# Patient Record
Sex: Female | Born: 1955 | Race: White | Hispanic: No | Smoking: Current every day smoker
Health system: Southern US, Community
[De-identification: ages and names within clinical notes are randomized; demographics above are authoritative.]

## PROBLEM LIST (undated history)

## (undated) DIAGNOSIS — E079 Disorder of thyroid, unspecified: Secondary | ICD-10-CM

## (undated) DIAGNOSIS — I10 Essential (primary) hypertension: Secondary | ICD-10-CM

## (undated) DIAGNOSIS — E785 Hyperlipidemia, unspecified: Secondary | ICD-10-CM

## (undated) HISTORY — DX: Hyperlipidemia, unspecified: E78.5

## (undated) HISTORY — DX: Essential (primary) hypertension: I10

## (undated) HISTORY — PX: ABDOMINAL HYSTERECTOMY: SHX81

## (undated) HISTORY — DX: Disorder of thyroid, unspecified: E07.9

---

## 2018-07-29 ENCOUNTER — Other Ambulatory Visit (HOSPITAL_COMMUNITY)
Admission: RE | Admit: 2018-07-29 | Discharge: 2018-07-29 | Disposition: A | Discharge status: Home / Self Care | Payer: Medicaid Other | Source: Ambulatory Visit | Attending: Physician Assistant | Admitting: Physician Assistant

## 2018-07-29 ENCOUNTER — Ambulatory Visit: Payer: Self-pay | Admitting: Physician Assistant

## 2018-07-29 ENCOUNTER — Other Ambulatory Visit: Payer: Self-pay | Admitting: Physician Assistant

## 2018-07-29 ENCOUNTER — Encounter: Payer: Self-pay | Admitting: Physician Assistant

## 2018-07-29 VITALS — BP 164/84 | HR 70 | Resp 14

## 2018-07-29 DIAGNOSIS — G8929 Other chronic pain: Secondary | ICD-10-CM

## 2018-07-29 DIAGNOSIS — I10 Essential (primary) hypertension: Secondary | ICD-10-CM | POA: Insufficient documentation

## 2018-07-29 DIAGNOSIS — M25512 Pain in left shoulder: Secondary | ICD-10-CM

## 2018-07-29 DIAGNOSIS — Z7689 Persons encountering health services in other specified circumstances: Secondary | ICD-10-CM

## 2018-07-29 DIAGNOSIS — Z1211 Encounter for screening for malignant neoplasm of colon: Secondary | ICD-10-CM

## 2018-07-29 DIAGNOSIS — E039 Hypothyroidism, unspecified: Secondary | ICD-10-CM | POA: Diagnosis not present

## 2018-07-29 DIAGNOSIS — Z5320 Procedure and treatment not carried out because of patient's decision for unspecified reasons: Secondary | ICD-10-CM | POA: Insufficient documentation

## 2018-07-29 DIAGNOSIS — F17200 Nicotine dependence, unspecified, uncomplicated: Secondary | ICD-10-CM | POA: Insufficient documentation

## 2018-07-29 DIAGNOSIS — E785 Hyperlipidemia, unspecified: Secondary | ICD-10-CM | POA: Diagnosis present

## 2018-07-29 DIAGNOSIS — R69 Illness, unspecified: Secondary | ICD-10-CM | POA: Diagnosis present

## 2018-07-29 LAB — COMPREHENSIVE METABOLIC PANEL
ALBUMIN: 4.2 g/dL (ref 3.5–5.0)
ALT: 15 U/L (ref 0–44)
AST: 23 U/L (ref 15–41)
Alkaline Phosphatase: 77 U/L (ref 38–126)
Anion gap: 11 (ref 5–15)
BILIRUBIN TOTAL: 0.6 mg/dL (ref 0.3–1.2)
BUN: 12 mg/dL (ref 6–20)
CHLORIDE: 103 mmol/L (ref 98–111)
CO2: 22 mmol/L (ref 22–32)
CREATININE: 0.64 mg/dL (ref 0.44–1.00)
Calcium: 9.6 mg/dL (ref 8.9–10.3)
GFR calc Af Amer: >60 mL/min (ref >60)
GFR calc non Af Amer: >60 mL/min (ref >60)
GLUCOSE: 77 mg/dL (ref 70–99)
Potassium: 4.5 mmol/L (ref 3.5–5.1)
Sodium: 136 mmol/L (ref 135–145)
Total Protein: 7.7 g/dL (ref 6.5–8.1)

## 2018-07-29 LAB — LIPID PANEL
CHOLESTEROL: 349 mg/dL — AB (ref 0–200)
HDL: 48 mg/dL (ref >40)
LDL Cholesterol: UNDETERMINED mg/dL (ref 0–99)
TRIGLYCERIDES: 720 mg/dL — AB (ref <150)
Total CHOL/HDL Ratio: 7.3 RATIO
VLDL: UNDETERMINED mg/dL (ref 0–40)

## 2018-07-29 LAB — TSH: TSH: 10.172 u[IU]/mL — AB (ref 0.350–4.500)

## 2018-07-29 MED ORDER — LOSARTAN POTASSIUM 50 MG PO TABS: 50.0000 mg | ORAL_TABLET | Freq: Every day | ORAL | 1 refills | Status: DC

## 2018-07-29 MED ORDER — LEVOTHYROXINE SODIUM 88 MCG PO TABS: 88.0000 ug | ORAL_TABLET | Freq: Every day | ORAL | 1 refills | Status: DC
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
## 2018-08-26 ENCOUNTER — Ambulatory Visit: Payer: Self-pay | Admitting: Physician Assistant

## 2018-08-26 ENCOUNTER — Encounter: Payer: Self-pay | Admitting: Physician Assistant

## 2018-08-26 VITALS — BP 146/88 | HR 103 | Temp 98.4°F | Ht 63.5 in | Wt 123.8 lb

## 2018-08-26 DIAGNOSIS — E785 Hyperlipidemia, unspecified: Secondary | ICD-10-CM

## 2018-08-26 DIAGNOSIS — M25512 Pain in left shoulder: Secondary | ICD-10-CM

## 2018-08-26 DIAGNOSIS — I10 Essential (primary) hypertension: Secondary | ICD-10-CM

## 2018-08-26 DIAGNOSIS — G8929 Other chronic pain: Secondary | ICD-10-CM

## 2018-08-26 DIAGNOSIS — E039 Hypothyroidism, unspecified: Secondary | ICD-10-CM | POA: Insufficient documentation

## 2018-08-26 DIAGNOSIS — F17200 Nicotine dependence, unspecified, uncomplicated: Secondary | ICD-10-CM

## 2018-08-26 MED ORDER — LOSARTAN POTASSIUM 100 MG PO TABS: 100.0000 mg | ORAL_TABLET | Freq: Every day | ORAL | 1 refills | Status: DC

## 2018-08-26 MED ORDER — LEVOTHYROXINE SODIUM 88 MCG PO TABS: 88.0000 ug | ORAL_TABLET | Freq: Every day | ORAL | 1 refills | Status: DC

## 2018-08-26 MED ORDER — ATORVASTATIN CALCIUM 20 MG PO TABS: 20.0000 mg | ORAL_TABLET | Freq: Every day | ORAL | 1 refills | Status: DC

## 2018-08-26 MED ORDER — DICLOFENAC SODIUM 75 MG PO TBEC: 75.0000 mg | DELAYED_RELEASE_TABLET | Freq: Two times a day (BID) | ORAL | 0 refills | Status: DC

## 2018-08-26 MED ORDER — FISH OIL 1000 MG PO CAPS: 4.0000 | ORAL_CAPSULE | Freq: Every day | ORAL | 0 refills | Status: DC

## 2018-08-26 NOTE — Progress Notes (Signed)
BP (!) 146/88 (BP Location: Left Arm, Patient Position: Sitting, Cuff Size: Normal)   Pulse (!) 103   Temp 98.4 F (36.9 C)   Ht 5' 3.5" (1.613 m)   Wt 123 lb 12 oz (56.1 kg)   SpO2 98%   BMI 21.58 kg/m    Subjective:    Patient ID: Annette Morrison, female    DOB: 04/09/1956, 62 y.o.   MRN: [id number]  HPI: Annette Morrison is a 62 y.o. female presenting on 08/26/2018 for Hypertension and Follow-up   HPI   Pt was seen by orthopedics Monday and she got injection but she says it didn't help.   She requests pain meds.   Pt says MRI done last year at [facility] but it's not in Epic.    She says they did "her whole total spine and her shoulder".  The dr that ordered it is Dr Sharion Settler in [place]  Pt says she never went back to get the results after she had the test.   Pt is feeling better on her meds.  She is still smoking.   Relevant past medical, surgical, family and social history reviewed and updated as indicated. Interim medical history since our last visit reviewed. Allergies and medications reviewed and updated.   Current Outpatient Medications:  .  aspirin 81 MG chewable tablet, Chew 81 mg by mouth daily., Disp: , Rfl:  .  levothyroxine (SYNTHROID, LEVOTHROID) 88 MCG tablet, Take 1 tablet (88 mcg total) by mouth daily., Disp: 30 tablet, Rfl: 1 .  losartan (COZAAR) 50 MG tablet, Take 1 tablet (50 mg total) by mouth daily., Disp: 30 tablet, Rfl: 1 .  naproxen sodium (ALEVE) 220 MG tablet, Take 220 mg by mouth., Disp: , Rfl:    Review of Systems  Constitutional: Negative for appetite change, chills, diaphoresis, fatigue, fever and unexpected weight change.  HENT: Negative for congestion, dental problem, drooling, ear pain, facial swelling, hearing loss, mouth sores, sneezing, sore throat, trouble swallowing and voice change.   Eyes: Negative for pain, discharge, redness, itching and visual disturbance.  Respiratory: Negative for cough, choking, shortness of breath  and wheezing.   Cardiovascular: Negative for chest pain, palpitations and leg swelling.  Gastrointestinal: Negative for abdominal pain, blood in stool, constipation, diarrhea and vomiting.  Endocrine: Negative for cold intolerance, heat intolerance and polydipsia.  Genitourinary: Negative for decreased urine volume, dysuria and hematuria.  Musculoskeletal: Positive for arthralgias, back pain and gait problem.  Skin: Negative for rash.  Allergic/Immunologic: Negative for environmental allergies.  Neurological: Positive for headaches. Negative for seizures, syncope and light-headedness.  Hematological: Negative for adenopathy.  Psychiatric/Behavioral: Positive for dysphoric mood. Negative for agitation and suicidal ideas. The patient is not nervous/anxious.     Per HPI unless specifically indicated above     Objective:    BP (!) 146/88 (BP Location: Left Arm, Patient Position: Sitting, Cuff Size: Normal)   Pulse (!) 103   Temp 98.4 F (36.9 C)   Ht 5' 3.5" (1.613 m)   Wt 123 lb 12 oz (56.1 kg)   SpO2 98%   BMI 21.58 kg/m   Wt Readings from Last 3 Encounters:  08/26/18 123 lb 12 oz (56.1 kg)    Physical Exam  Constitutional: She is oriented to person, place, and time. She appears well-developed and well-nourished.  HENT:  Head: Normocephalic and atraumatic.  Neck: Neck supple.  Cardiovascular: Normal rate and regular rhythm.  Pulmonary/Chest: Effort normal and breath sounds normal.  Abdominal: Soft. Bowel  sounds are normal. She exhibits no mass. There is no hepatosplenomegaly. There is no tenderness.  Musculoskeletal: She exhibits no edema.  Lymphadenopathy:    She has no cervical adenopathy.  Neurological: She is alert and oriented to person, place, and time.  Skin: Skin is warm and dry.  Psychiatric: She has a normal mood and affect. Her behavior is normal.  Vitals reviewed.   Results for orders placed or performed during the hospital encounter of 07/29/18  TSH  Result  Value Ref Range   TSH 10.172 (H) 0.350 - 4.500 uIU/mL  Lipid panel  Result Value Ref Range   Cholesterol 349 (H) 0 - 200 mg/dL   Triglycerides 132 (H) <150 mg/dL   HDL 48 >44 mg/dL   Total CHOL/HDL Ratio 7.3 RATIO   VLDL UNABLE TO CALCULATE IF TRIGLYCERIDE OVER 400 mg/dL 0 - 40 mg/dL   LDL Cholesterol UNABLE TO CALCULATE IF TRIGLYCERIDE OVER 400 mg/dL 0 - 99 mg/dL  Comprehensive metabolic panel  Result Value Ref Range   Sodium 136 135 - 145 mmol/L   Potassium 4.5 3.5 - 5.1 mmol/L   Chloride 103 98 - 111 mmol/L   CO2 22 22 - 32 mmol/L   Glucose, Bld 77 70 - 99 mg/dL   BUN 12 6 - 20 mg/dL   Creatinine, Ser 0.10 0.44 - 1.00 mg/dL   Calcium 9.6 8.9 - 27.2 mg/dL   Total Protein 7.7 6.5 - 8.1 g/dL   Albumin 4.2 3.5 - 5.0 g/dL   AST 23 15 - 41 U/L   ALT 15 0 - 44 U/L   Alkaline Phosphatase 77 38 - 126 U/L   Total Bilirubin 0.6 0.3 - 1.2 mg/dL   GFR calc non Af Amer >60 >60 mL/min   GFR calc Af Amer >60 >60 mL/min   Anion gap 11 5 - 15      Assessment & Plan:    Encounter Diagnoses  Name Primary?  . Essential hypertension Yes  . Hyperlipidemia, unspecified hyperlipidemia type   . Hypothyroidism, unspecified type   . Tobacco use disorder   . Chronic left shoulder pain      -reviewed labs with pt -pt to start Fish oil 4 daily and atorvastatin -will Increase losartan to help blood pressure -pt is signed up for med[facility]ist to get help with getting her medications -record request sent to [facility] to get MRI results -pt is given rx diclofenac for pain.  She is counseled to avoid taking aleve or IBU while taking the diclofenac.  She also needs to take this medication with food.  Discussed with pt that [facility] does not prescribe narcotics -will Continue current levothyroxine (she was off it some time before labs done) -pt to follow up 4 weeks to recheck blood pressure and review MRI.  RTO sooner prn

## 2018-08-26 NOTE — Patient Instructions (Signed)

## 2018-09-22 ENCOUNTER — Ambulatory Visit: Payer: Self-pay | Admitting: Physician Assistant

## 2018-09-22 ENCOUNTER — Encounter: Payer: Self-pay | Admitting: Physician Assistant

## 2018-09-22 VITALS — BP 140/90 | HR 90 | Temp 97.9°F | Ht 63.5 in | Wt 125.2 lb

## 2018-09-22 DIAGNOSIS — G8929 Other chronic pain: Secondary | ICD-10-CM

## 2018-09-22 DIAGNOSIS — I10 Essential (primary) hypertension: Secondary | ICD-10-CM

## 2018-09-22 DIAGNOSIS — E785 Hyperlipidemia, unspecified: Secondary | ICD-10-CM

## 2018-09-22 DIAGNOSIS — F17200 Nicotine dependence, unspecified, uncomplicated: Secondary | ICD-10-CM

## 2018-09-22 DIAGNOSIS — E039 Hypothyroidism, unspecified: Secondary | ICD-10-CM

## 2018-09-22 DIAGNOSIS — M25512 Pain in left shoulder: Secondary | ICD-10-CM

## 2018-09-22 NOTE — Progress Notes (Signed)
   BP 140/90 (BP Location: Left Arm, Patient Position: Sitting, Cuff Size: Normal)   Pulse 90   Temp 97.9 F (36.6 C)   Ht 5' 3.5" (1.613 m)   Wt 125 lb 4 oz (56.8 kg)   SpO2 97%   BMI 21.84 kg/m    Subjective:    Patient ID: Annette Morrison, female    DOB: 09-04-1956, 62 y.o.   MRN: [id number]  HPI: Annette Morrison is a 62 y.o. female presenting on 09/22/2018 for Hypertension   HPI  Pt went to orthopedist yesterday and she says they are planning to do surgery on her shoulder.  Relevant past medical, surgical, family and social history reviewed and updated as indicated. Interim medical history since our last visit reviewed. Allergies and medications reviewed and updated.  CURRENT MEDS: unknown  Review of Systems  Constitutional: Positive for chills and diaphoresis. Negative for appetite change, fatigue, fever and unexpected weight change.  HENT: Negative for congestion, drooling, ear pain, facial swelling, hearing loss, mouth sores, sneezing, sore throat, trouble swallowing and voice change.   Eyes: Negative for pain, discharge, redness, itching and visual disturbance.  Respiratory: Negative for cough, choking, shortness of breath and wheezing.   Cardiovascular: Negative for chest pain, palpitations and leg swelling.  Gastrointestinal: Negative for abdominal pain, blood in stool, constipation, diarrhea and vomiting.  Endocrine: Negative for cold intolerance, heat intolerance and polydipsia.  Genitourinary: Negative for decreased urine volume, dysuria and hematuria.  Musculoskeletal: Positive for arthralgias, back pain and gait problem.  Skin: Negative for rash.  Allergic/Immunologic: Negative for environmental allergies.  Neurological: Positive for headaches. Negative for seizures, syncope and light-headedness.  Hematological: Negative for adenopathy.  Psychiatric/Behavioral: Positive for dysphoric mood. Negative for agitation and suicidal ideas. The patient is nervous/anxious.        Per HPI unless specifically indicated above     Objective:    BP 140/90 (BP Location: Left Arm, Patient Position: Sitting, Cuff Size: Normal)   Pulse 90   Temp 97.9 F (36.6 C)   Ht 5' 3.5" (1.613 m)   Wt 125 lb 4 oz (56.8 kg)   SpO2 97%   BMI 21.84 kg/m   Wt Readings from Last 3 Encounters:  09/22/18 125 lb 4 oz (56.8 kg)  08/26/18 123 lb 12 oz (56.1 kg)    Physical Exam  Constitutional: She is oriented to person, place, and time. She appears well-developed and well-nourished.  HENT:  Head: Normocephalic and atraumatic.  Neck: Neck supple.  Cardiovascular: Normal rate and regular rhythm.  Pulmonary/Chest: Effort normal and breath sounds normal.  Abdominal: Soft. Bowel sounds are normal. She exhibits no mass. There is no hepatosplenomegaly. There is no tenderness.  Musculoskeletal: She exhibits no edema.  Lymphadenopathy:    She has no cervical adenopathy.  Neurological: She is alert and oriented to person, place, and time.  Skin: Skin is warm and dry.  Psychiatric: She has a normal mood and affect. Her behavior is normal.  Vitals reviewed.       Assessment & Plan:    Encounter Diagnoses  Name Primary?  . Essential hypertension Yes  . Hypothyroidism, unspecified type   . Hyperlipidemia, unspecified hyperlipidemia type   . Tobacco use disorder   . Chronic left shoulder pain     -no changes today -pt counseled to bring meds to every appointment -counseled smoking cessaiton -pt to follow up 2 months.   RTO sooner prn

## 2018-11-23 ENCOUNTER — Ambulatory Visit: Payer: Self-pay | Admitting: Physician Assistant

## 2018-11-23 ENCOUNTER — Encounter: Payer: Self-pay | Admitting: Physician Assistant

## 2018-11-23 VITALS — BP 120/70 | HR 81 | Temp 97.7°F | Ht 63.5 in | Wt 123.2 lb

## 2018-11-23 DIAGNOSIS — Z9119 Patient's noncompliance with other medical treatment and regimen: Secondary | ICD-10-CM

## 2018-11-23 DIAGNOSIS — E785 Hyperlipidemia, unspecified: Secondary | ICD-10-CM

## 2018-11-23 DIAGNOSIS — Z91199 Patient's noncompliance with other medical treatment and regimen due to unspecified reason: Secondary | ICD-10-CM

## 2018-11-23 DIAGNOSIS — F17200 Nicotine dependence, unspecified, uncomplicated: Secondary | ICD-10-CM

## 2018-11-23 DIAGNOSIS — I10 Essential (primary) hypertension: Secondary | ICD-10-CM

## 2018-11-23 DIAGNOSIS — G8929 Other chronic pain: Secondary | ICD-10-CM

## 2018-11-23 DIAGNOSIS — Z5320 Procedure and treatment not carried out because of patient's decision for unspecified reasons: Secondary | ICD-10-CM

## 2018-11-23 DIAGNOSIS — E039 Hypothyroidism, unspecified: Secondary | ICD-10-CM

## 2018-11-23 DIAGNOSIS — M25512 Pain in left shoulder: Secondary | ICD-10-CM

## 2018-11-23 NOTE — Progress Notes (Signed)
BP 120/70 (BP Location: Left Arm, Patient Position: Sitting, Cuff Size: Normal)   Pulse 81   Temp 97.7 F (36.5 C)   Ht 5' 3.5" (1.613 m)   Wt 123 lb 4 oz (55.9 kg)   SpO2 96%   BMI 21.49 kg/m    Subjective:    Patient ID: Annette Morrison, female    DOB: [birth year], 63 y.o.   MRN: [id number]  HPI: Annette Morrison is a 63 y.o. female presenting on 11/23/2018 for Hypertension; Hyperlipidemia; and Thyroid Problem   HPI  At her last OV, pt said that she was scheduled for shoulder surgery.  She says it was to be done through workers comp.  Pt did not get her shoulder surgery done because she "had a bad feeling about it"  She did not bring her meds again to her appointment today  She has not gotten her labs done as ordered  She is still smoking.   Relevant past medical, surgical, family and social history reviewed and updated as indicated. Interim medical history since our last visit reviewed. Allergies and medications reviewed and updated.   Current Outpatient Medications:  .  atorvastatin (LIPITOR) 20 MG tablet, Take 1 tablet (20 mg total) by mouth daily., Disp: 90 tablet, Rfl: 1 .  levothyroxine (SYNTHROID, LEVOTHROID) 88 MCG tablet, Take 1 tablet (88 mcg total) by mouth daily., Disp: 90 tablet, Rfl: 1 .  losartan (COZAAR) 100 MG tablet, Take 1 tablet (100 mg total) by mouth daily., Disp: 90 tablet, Rfl: 1     Review of Systems  Constitutional: Positive for diaphoresis. Negative for appetite change, chills, fatigue, fever and unexpected weight change.  HENT: Negative for congestion, dental problem, drooling, ear pain, facial swelling, hearing loss, mouth sores, sneezing, sore throat, trouble swallowing and voice change.   Eyes: Negative for pain, discharge, redness, itching and visual disturbance.  Respiratory: Negative for cough, choking, shortness of breath and wheezing.   Cardiovascular: Negative for chest pain, palpitations and leg swelling.  Gastrointestinal: Negative  for abdominal pain, blood in stool, constipation, diarrhea and vomiting.  Endocrine: Negative for cold intolerance, heat intolerance and polydipsia.  Genitourinary: Negative for decreased urine volume, dysuria and hematuria.  Musculoskeletal: Positive for arthralgias and back pain. Negative for gait problem.  Skin: Negative for rash.  Allergic/Immunologic: Negative for environmental allergies.  Neurological: Negative for seizures, syncope, light-headedness and headaches.  Hematological: Negative for adenopathy.  Psychiatric/Behavioral: Negative for agitation, dysphoric mood and suicidal ideas. The patient is not nervous/anxious.     Per HPI unless specifically indicated above     Objective:    BP 120/70 (BP Location: Left Arm, Patient Position: Sitting, Cuff Size: Normal)   Pulse 81   Temp 97.7 F (36.5 C)   Ht 5' 3.5" (1.613 m)   Wt 123 lb 4 oz (55.9 kg)   SpO2 96%   BMI 21.49 kg/m   Wt Readings from Last 3 Encounters:  11/23/18 123 lb 4 oz (55.9 kg)  09/22/18 125 lb 4 oz (56.8 kg)  08/26/18 123 lb 12 oz (56.1 kg)    Physical Exam Vitals signs reviewed.  Constitutional:      Appearance: She is well-developed.  HENT:     Head: Normocephalic and atraumatic.  Neck:     Musculoskeletal: Neck supple.  Cardiovascular:     Rate and Rhythm: Normal rate and regular rhythm.  Pulmonary:     Effort: Pulmonary effort is normal.     Breath sounds: Normal breath sounds.  Abdominal:  General: Bowel sounds are normal.     Palpations: Abdomen is soft. There is no mass.     Tenderness: There is no abdominal tenderness.  Lymphadenopathy:     Cervical: No cervical adenopathy.  Skin:    General: Skin is warm and dry.  Neurological:     Mental Status: She is alert and oriented to person, place, and time.  Psychiatric:        Behavior: Behavior normal.         Assessment & Plan:   Encounter Diagnoses  Name Primary?  . Essential hypertension Yes  . Hypothyroidism,  unspecified type   . Hyperlipidemia, unspecified hyperlipidemia type   . Tobacco use disorder   . Mammogram declined   . Chronic left shoulder pain   . Personal history of noncompliance with medical treatment, presenting hazards to health     -pt was given application for [facility] (she says she has a bill and that is why she did  Not get her labs drawn) -pt counseled to Get her labs drawn. -No changes today in medications.  She is reminded that she should bring her medications with her to every appointment -counseled smoking cessation -pt again declined to get screening mammogram -pt to follow up 3 months.  RTO sooner prn

## 2019-02-22 ENCOUNTER — Ambulatory Visit: Payer: Self-pay | Admitting: Physician Assistant

## 2019-03-11 ENCOUNTER — Emergency Department (HOSPITAL_COMMUNITY)
Admission: EM | Admit: 2019-03-11 | Discharge: 2019-03-11 | Disposition: A | Discharge status: Home / Self Care | Payer: Medicaid Other | Attending: Emergency Medicine | Admitting: Emergency Medicine

## 2019-03-11 ENCOUNTER — Encounter (HOSPITAL_COMMUNITY): Payer: Self-pay | Admitting: Emergency Medicine

## 2019-03-11 ENCOUNTER — Other Ambulatory Visit: Payer: Self-pay

## 2019-03-11 ENCOUNTER — Emergency Department (HOSPITAL_COMMUNITY): Payer: Medicaid Other

## 2019-03-11 DIAGNOSIS — F17210 Nicotine dependence, cigarettes, uncomplicated: Secondary | ICD-10-CM | POA: Diagnosis not present

## 2019-03-11 DIAGNOSIS — R1011 Right upper quadrant pain: Secondary | ICD-10-CM | POA: Diagnosis present

## 2019-03-11 DIAGNOSIS — K2970 Gastritis, unspecified, without bleeding: Secondary | ICD-10-CM | POA: Insufficient documentation

## 2019-03-11 DIAGNOSIS — Z79899 Other long term (current) drug therapy: Secondary | ICD-10-CM | POA: Diagnosis not present

## 2019-03-11 DIAGNOSIS — I10 Essential (primary) hypertension: Secondary | ICD-10-CM | POA: Diagnosis not present

## 2019-03-11 DIAGNOSIS — E039 Hypothyroidism, unspecified: Secondary | ICD-10-CM | POA: Insufficient documentation

## 2019-03-11 DIAGNOSIS — R3129 Other microscopic hematuria: Secondary | ICD-10-CM | POA: Diagnosis not present

## 2019-03-11 DIAGNOSIS — R3121 Asymptomatic microscopic hematuria: Secondary | ICD-10-CM

## 2019-03-11 LAB — URINALYSIS, ROUTINE W REFLEX MICROSCOPIC
Bacteria, UA: NONE SEEN
Bilirubin Urine: NEGATIVE
Glucose, UA: NEGATIVE mg/dL
Ketones, ur: NEGATIVE mg/dL
Leukocytes,Ua: NEGATIVE
Nitrite: NEGATIVE
Protein, ur: 30 mg/dL — AB
RBC / HPF: >50 RBC/hpf — ABNORMAL HIGH (ref 0–5)
Specific Gravity, Urine: 1.015 (ref 1.005–1.030)
pH: 7 (ref 5.0–8.0)

## 2019-03-11 LAB — CBC WITH DIFFERENTIAL/PLATELET
Abs Immature Granulocytes: 0.07 10*3/uL (ref 0.00–0.07)
Basophils Absolute: 0.1 10*3/uL (ref 0.0–0.1)
Basophils Relative: 1 %
Eosinophils Absolute: 0.1 10*3/uL (ref 0.0–0.5)
Eosinophils Relative: 1 %
HCT: 42.6 % (ref 36.0–46.0)
Hemoglobin: 13.9 g/dL (ref 12.0–15.0)
Immature Granulocytes: 1 %
Lymphocytes Relative: 34 %
Lymphs Abs: 3.3 10*3/uL (ref 0.7–4.0)
MCH: 30.3 pg (ref 26.0–34.0)
MCHC: 32.6 g/dL (ref 30.0–36.0)
MCV: 93 fL (ref 80.0–100.0)
Monocytes Absolute: 0.5 10*3/uL (ref 0.1–1.0)
Monocytes Relative: 6 %
Neutro Abs: 5.7 10*3/uL (ref 1.7–7.7)
Neutrophils Relative %: 57 %
Platelets: 228 10*3/uL (ref 150–400)
RBC: 4.58 MIL/uL (ref 3.87–5.11)
RDW: 16.2 % — ABNORMAL HIGH (ref 11.5–15.5)
WBC: 9.8 10*3/uL (ref 4.0–10.5)
nRBC: 0 % (ref 0.0–0.2)

## 2019-03-11 LAB — COMPREHENSIVE METABOLIC PANEL
ALT: 19 U/L (ref 0–44)
AST: 36 U/L (ref 15–41)
Albumin: 4.6 g/dL (ref 3.5–5.0)
Alkaline Phosphatase: 112 U/L (ref 38–126)
Anion gap: 10 (ref 5–15)
BUN: 18 mg/dL (ref 8–23)
CO2: 23 mmol/L (ref 22–32)
Calcium: 10 mg/dL (ref 8.9–10.3)
Chloride: 102 mmol/L (ref 98–111)
Creatinine, Ser: 0.95 mg/dL (ref 0.44–1.00)
GFR calc Af Amer: >60 mL/min (ref >60)
GFR calc non Af Amer: >60 mL/min (ref >60)
Glucose, Bld: 96 mg/dL (ref 70–99)
Potassium: 3.9 mmol/L (ref 3.5–5.1)
Sodium: 135 mmol/L (ref 135–145)
Total Bilirubin: 0.6 mg/dL (ref 0.3–1.2)
Total Protein: 8.5 g/dL — ABNORMAL HIGH (ref 6.5–8.1)

## 2019-03-11 LAB — LIPASE, BLOOD: Lipase: 47 U/L (ref 11–51)

## 2019-03-11 LAB — TROPONIN I: Troponin I: <0.03 ng/mL (ref <0.03)

## 2019-03-11 MED ORDER — DOXYCYCLINE HYCLATE 100 MG PO CAPS: 100.0000 mg | ORAL_CAPSULE | Freq: Two times a day (BID) | ORAL | 0 refills | Status: DC

## 2019-03-11 MED ORDER — FAMOTIDINE 20 MG PO TABS: 20.0000 mg | ORAL_TABLET | Freq: Every day | ORAL | 0 refills | Status: DC

## 2019-03-11 MED ORDER — IOHEXOL 300 MG/ML  SOLN
100.0000 mL | Freq: Once | INTRAMUSCULAR | Status: AC | PRN
  Administered 2019-03-11: 100 mL via INTRAVENOUS

## 2019-03-11 MED ORDER — LOSARTAN POTASSIUM 100 MG PO TABS: 100.0000 mg | ORAL_TABLET | Freq: Every day | ORAL | 0 refills | Status: DC

## 2019-03-11 MED ORDER — MORPHINE SULFATE (PF) 4 MG/ML IV SOLN
4.0000 mg | Freq: Once | INTRAVENOUS | Status: AC
  Administered 2019-03-11: 20:00:00 4 mg via INTRAVENOUS
  Filled 2019-03-11: qty 1

## 2019-03-11 MED ORDER — OXYCODONE-ACETAMINOPHEN 5-325 MG PO TABS
1.0000 | ORAL_TABLET | Freq: Once | ORAL | Status: AC
  Administered 2019-03-11: 23:00:00 1 via ORAL
  Filled 2019-03-11: qty 1

## 2019-03-11 MED ORDER — ASPIRIN 81 MG PO CHEW
324.0000 mg | CHEWABLE_TABLET | Freq: Once | ORAL | Status: DC
  Filled 2019-03-11: qty 4

## 2019-03-11 NOTE — Discharge Instructions (Addendum)
You were seen in the emergency department for various complaints including chest pain shortness of breath abdominal pain.  You had multiple tests including a CAT scan of your abdomen and blood work EKG.  We are treating you with antibiotics and acid medication.  I am also refilling your blood pressure medication.  It will be very important for you to follow-up with your primary care doctor for continued management and work-up of your symptoms.  Please return if any worsening symptoms.

## 2019-03-11 NOTE — ED Triage Notes (Signed)
Pt states that she is having pain in her right flank this has been going on for over month.

## 2019-03-11 NOTE — ED Notes (Signed)
Pt states she does not need to urinate at this time, aware of DO, call light w/in reach and notified to call when she has to urinate

## 2019-03-11 NOTE — ED Provider Notes (Signed)
[facility] EMERGENCY DEPARTMENT Provider Note   CSN: [id number] Arrival date & time: 03/11/19  1737    History   Chief Complaint Chief Complaint  Patient presents with  . Flank Pain    HPI Nashly Legendre is a 63 y.o. female.  She has a history of hypertension has not taken her medicines in a few months because of no insurance.  She is complaining of 1 month of tightness across to her upper abdomen into her back more so in the right upper quadrant tightness which has become more intense over the past few days.  She also says she feels short of breath and has been having on and off chest pain.  She feels her bowels are moving less frequently and she has decreased appetite.  No fevers.  She has had a nonproductive cough.  She feels short of breath at times.  She is a smoker 1 pack/day.  No urinary symptoms.     The history is provided by the patient.  Flank Pain  This is a new problem. The current episode started more than 1 week ago. The problem occurs constantly. The problem has been gradually worsening. Associated symptoms include chest pain, abdominal pain and shortness of breath. Pertinent negatives include no headaches. Nothing aggravates the symptoms. Nothing relieves the symptoms. She has tried nothing for the symptoms. The treatment provided no relief.    Past Medical History:  Diagnosis Date  . Hyperlipidemia   . Hypertension   . Thyroid disease     Patient Active Problem List   Diagnosis Date Noted  . Hyperlipidemia 08/26/2018  . Hypothyroidism 08/26/2018  . Essential hypertension 07/29/2018  . Mammogram declined 07/29/2018  . Tobacco use disorder 07/29/2018    Past Surgical History:  Procedure Laterality Date  . ABDOMINAL HYSTERECTOMY       OB History   No obstetric history on file.      Home Medications    Prior to Admission medications   Medication Sig Start Date End Date Taking? Authorizing Provider  atorvastatin (LIPITOR) 20 MG tablet Take 1 tablet  (20 mg total) by mouth daily. 08/26/18   Jacquelin Hawking, PA-C  levothyroxine (SYNTHROID, LEVOTHROID) 88 MCG tablet Take 1 tablet (88 mcg total) by mouth daily. 08/26/18   Jacquelin Hawking, PA-C  losartan (COZAAR) 100 MG tablet Take 1 tablet (100 mg total) by mouth daily. 08/26/18   Jacquelin Hawking, PA-C    Family History Family History  Problem Relation Age of Onset  . Diabetes Mother   . Heart disease Father     Social History Social History   Tobacco Use  . Smoking status: Current Every Day Smoker    Packs/day: 1.00    Years: 48.00    Pack years: 48.00  . Smokeless tobacco: Never Used  Substance Use Topics  . Alcohol use: Not Currently    Frequency: Never  . Drug use: Never     Allergies   Brisdelle [paroxetine mesylate]; Bee venom; and Hydrocodone   Review of Systems Review of Systems  Constitutional: Positive for appetite change. Negative for fever.  HENT: Negative for sore throat.   Eyes: Negative for visual disturbance.  Respiratory: Positive for cough, chest tightness and shortness of breath.   Cardiovascular: Positive for chest pain. Negative for leg swelling.  Gastrointestinal: Positive for abdominal pain and constipation. Negative for blood in stool, nausea and vomiting.  Genitourinary: Positive for flank pain. Negative for dysuria.  Musculoskeletal: Positive for back pain. Negative for neck pain.  Skin: Negative for rash.  Neurological: Negative for headaches.     Physical Exam Updated Vital Signs BP (!) 141/128 (BP Location: Right Arm)   Pulse 96   Temp 98.2 F (36.8 C) (Oral)   Resp 14   Ht 5\' 4"  (1.626 m)   Wt 59 kg   SpO2 100%   BMI 22.31 kg/m   Physical Exam Vitals signs and nursing note reviewed.  Constitutional:      General: She is not in acute distress.    Appearance: She is well-developed.  HENT:     Head: Normocephalic and atraumatic.  Eyes:     Conjunctiva/sclera: Conjunctivae normal.  Neck:     Musculoskeletal: Neck  supple.  Cardiovascular:     Rate and Rhythm: Normal rate and regular rhythm.     Pulses: Normal pulses.     Heart sounds: Normal heart sounds. No murmur.  Pulmonary:     Effort: Pulmonary effort is normal. No respiratory distress.     Breath sounds: Normal breath sounds.  Abdominal:     Palpations: Abdomen is soft.     Tenderness: There is no abdominal tenderness. There is no guarding or rebound.  Musculoskeletal: Normal range of motion.        General: No signs of injury.     Right lower leg: No edema.     Left lower leg: No edema.  Skin:    General: Skin is warm and dry.  Neurological:     General: No focal deficit present.     Mental Status: She is alert and oriented to person, place, and time.     Sensory: No sensory deficit.     Motor: No weakness.      ED Treatments / Results  Labs (all labs ordered are listed, but only abnormal results are displayed) Labs Reviewed  URINALYSIS, ROUTINE W REFLEX MICROSCOPIC - Abnormal; Notable for the following components:      Result Value   APPearance HAZY (*)    Hgb urine dipstick MODERATE (*)    Protein, ur 30 (*)    RBC / HPF >50 (*)    All other components within normal limits  COMPREHENSIVE METABOLIC PANEL - Abnormal; Notable for the following components:   Total Protein 8.5 (*)    All other components within normal limits  CBC WITH DIFFERENTIAL/PLATELET - Abnormal; Notable for the following components:   RDW 16.2 (*)    All other components within normal limits  LIPASE, BLOOD  TROPONIN I    EKG EKG Interpretation  Date/Time:  Thursday Mar 11 2019 20:53:54 EDT Ventricular Rate:  75 PR Interval:    QRS Duration: 80 QT Interval:  378 QTC Calculation: 420 R Axis:   -22 Text Interpretation:  Sinus rhythm Anterior infarct, old no prior to compare with Confirmed by Meridee ScoreButler, Demaurion Dicioccio [phone]([id number]) on 03/11/2019 8:58:57 PM   Radiology Ct Abdomen Pelvis W Contrast  Result Date: 03/11/2019 CLINICAL DATA:  Generalized abdominal  pain EXAM: CT ABDOMEN AND PELVIS WITH CONTRAST TECHNIQUE: Multidetector CT imaging of the abdomen and pelvis was performed using the standard protocol following bolus administration of intravenous contrast. CONTRAST:  100mL OMNIPAQUE IOHEXOL 300 MG/ML  SOLN COMPARISON:  None. FINDINGS: Lower chest: Airspace opacity in the lingula could reflect atelectasis, scarring or infiltrate/pneumonia. Heart is normal size. No effusions. Hepatobiliary: No focal hepatic abnormality. Gallbladder unremarkable. Pancreas: No focal abnormality or ductal dilatation. Spleen: No focal abnormality.  Normal size. Adrenals/Urinary Tract: No focal abnormality. Normal size. Small nonobstructing  stones in the lower pole of both kidneys and upper pole of the left kidney. No ureteral stones or hydronephrosis. Urinary bladder is decompressed. Adrenal glands unremarkable. Stomach/Bowel: The distal stomach wall and proximal duodenum appear mildly thickened raising the possibility of gastritis. Small bowel decompressed. Moderate stool burden in the right colon. Colon otherwise unremarkable. Vascular/Lymphatic: Aortic atherosclerosis. No enlarged abdominal or pelvic lymph nodes. Reproductive: Prior hysterectomy.  No adnexal masses. Other: No free fluid or free air. Musculoskeletal: Moderate compression deformity through the superior endplate of L5, age indeterminate. Probable slight compression deformity at T12 superior endplate as well. IMPRESSION: Airspace disease in the lingula could reflect atelectasis, scarring or pneumonia. Bilateral nephrolithiasis.  No hydronephrosis. Distal gastric wall and proximal duodenal wall appear thickened suggesting gastritis. Age-indeterminate compression deformities through the superior endplate of L5 and superior endplate of T12. Electronically Signed   By: Charlett Nose M.D.   On: 03/11/2019 22:10   Dg Chest Port 1 View  Result Date: 03/11/2019 CLINICAL DATA:  Chest pain.  Shortness of breath. EXAM: PORTABLE  CHEST 1 VIEW COMPARISON:  None. FINDINGS: Lungs appear somewhat hyperexpanded. The cardiac silhouette is mildly enlarged. A background of emphysematous changes is suspected. A calcified granuloma is noted in the left upper lobe. There is pleural-parenchymal scarring at the lung apices, right worse than left. IMPRESSION: 1. No acute cardiopulmonary process. 2. Emphysematous changes suspected bilaterally. 3. Scattered calcified granulomas. Electronically Signed   By: Katherine Mantle M.D.   On: 03/11/2019 20:18    Procedures Procedures (including critical care time)  Medications Ordered in ED Medications  aspirin chewable tablet 324 mg (has no administration in time range)  morphine 4 MG/ML injection 4 mg (has no administration in time range)     Initial Impression / Assessment and Plan / ED Course  I have reviewed the triage vital signs and the nursing notes.  Pertinent labs & imaging results that were available during my care of the patient were reviewed by me and considered in my medical decision making (see chart for details).  Clinical Course as of Mar 11 1301  Thu Mar 11, 2019  2247 Differential diagnosis includes pneumonia, ACS, cholelithiasis, gastritis, malignancy, urinary tract infection.  Her work-up lab wise has been fairly unremarkable although her urine does show greater than 50 reds.  Her CT shows some gastric thickening along with some indeterminate age compression fractures.  It also showed some lingular airspace disease unclear if scarring atelectasis versus infiltrate.  I think is reasonable to cover her with some Doxy for her breathing and put her on some acid medication.  It is unclear if because of her lack of insurance she is going to be able to follow-up for these things.   [MB]  2255 I reviewed the results with the patient and she is comfortable with the plan.  We will send her home on some antibiotics and some medication for gastritis and refill her blood pressure  medication.  She understands ultimately that she is going to need outpatient follow-up for management of her symptoms.   [MB]    Clinical Course User Index [MB] Terrilee Files, MD        Final Clinical Impressions(s) / ED Diagnoses   Final diagnoses:  Gastritis without bleeding, unspecified chronicity, unspecified gastritis type  Asymptomatic microscopic hematuria    ED Discharge Orders         Ordered    losartan (COZAAR) 100 MG tablet  Daily     03/11/19 2258  doxycycline (VIBRAMYCIN) 100 MG capsule  2 times daily     03/11/19 2258    famotidine (PEPCID) 20 MG tablet  Daily     03/11/19 2258           Terrilee Files, MD 03/12/19 1302

## 2019-03-25 ENCOUNTER — Other Ambulatory Visit: Payer: Medicaid Other

## 2019-03-25 ENCOUNTER — Telehealth: Payer: Self-pay

## 2019-03-25 DIAGNOSIS — Z20822 Contact with and (suspected) exposure to covid-19: Secondary | ICD-10-CM

## 2019-03-25 NOTE — Telephone Encounter (Signed)
Lanora Manis, CMA from [facility] called and says the patient is waiting at the testing site and will need an order placed for covid testing. I scheduled the appointment for today at 1245 at [facility] in [place] and the order was placed. She says she will call to let them know the order is in.

## 2019-03-29 LAB — NOVEL CORONAVIRUS, NAA: SARS-CoV-2, NAA: NOT DETECTED

## 2019-06-25 ENCOUNTER — Encounter (HOSPITAL_COMMUNITY): Payer: Self-pay | Admitting: Emergency Medicine

## 2019-06-25 ENCOUNTER — Emergency Department (HOSPITAL_COMMUNITY): Payer: Medicaid Other

## 2019-06-25 ENCOUNTER — Other Ambulatory Visit: Payer: Self-pay

## 2019-06-25 ENCOUNTER — Inpatient Hospital Stay (HOSPITAL_COMMUNITY)
Admission: EM | Admit: 2019-06-25 | Discharge: 2019-06-29 | DRG: 280 | Disposition: A | Discharge status: Home / Self Care | Payer: Medicaid Other | Attending: Internal Medicine | Admitting: Internal Medicine

## 2019-06-25 ENCOUNTER — Inpatient Hospital Stay (HOSPITAL_COMMUNITY): Payer: Medicaid Other

## 2019-06-25 DIAGNOSIS — E785 Hyperlipidemia, unspecified: Secondary | ICD-10-CM | POA: Diagnosis present

## 2019-06-25 DIAGNOSIS — F17210 Nicotine dependence, cigarettes, uncomplicated: Secondary | ICD-10-CM | POA: Diagnosis present

## 2019-06-25 DIAGNOSIS — I5020 Unspecified systolic (congestive) heart failure: Secondary | ICD-10-CM | POA: Diagnosis not present

## 2019-06-25 DIAGNOSIS — E039 Hypothyroidism, unspecified: Secondary | ICD-10-CM | POA: Diagnosis not present

## 2019-06-25 DIAGNOSIS — E782 Mixed hyperlipidemia: Secondary | ICD-10-CM | POA: Diagnosis present

## 2019-06-25 DIAGNOSIS — Z91030 Bee allergy status: Secondary | ICD-10-CM

## 2019-06-25 DIAGNOSIS — I25110 Atherosclerotic heart disease of native coronary artery with unstable angina pectoris: Secondary | ICD-10-CM | POA: Diagnosis not present

## 2019-06-25 DIAGNOSIS — Z720 Tobacco use: Secondary | ICD-10-CM | POA: Diagnosis not present

## 2019-06-25 DIAGNOSIS — I6310 Cerebral infarction due to embolism of unspecified precerebral artery: Secondary | ICD-10-CM | POA: Diagnosis not present

## 2019-06-25 DIAGNOSIS — I6522 Occlusion and stenosis of left carotid artery: Secondary | ICD-10-CM | POA: Diagnosis present

## 2019-06-25 DIAGNOSIS — Z20828 Contact with and (suspected) exposure to other viral communicable diseases: Secondary | ICD-10-CM | POA: Diagnosis not present

## 2019-06-25 DIAGNOSIS — Z888 Allergy status to other drugs, medicaments and biological substances status: Secondary | ICD-10-CM

## 2019-06-25 DIAGNOSIS — R4781 Slurred speech: Secondary | ICD-10-CM | POA: Diagnosis not present

## 2019-06-25 DIAGNOSIS — R29810 Facial weakness: Secondary | ICD-10-CM | POA: Diagnosis present

## 2019-06-25 DIAGNOSIS — Z79890 Hormone replacement therapy (postmenopausal): Secondary | ICD-10-CM | POA: Diagnosis not present

## 2019-06-25 DIAGNOSIS — Z79899 Other long term (current) drug therapy: Secondary | ICD-10-CM

## 2019-06-25 DIAGNOSIS — I214 Non-ST elevation (NSTEMI) myocardial infarction: Secondary | ICD-10-CM

## 2019-06-25 DIAGNOSIS — K263 Acute duodenal ulcer without hemorrhage or perforation: Secondary | ICD-10-CM

## 2019-06-25 DIAGNOSIS — I5021 Acute systolic (congestive) heart failure: Secondary | ICD-10-CM

## 2019-06-25 DIAGNOSIS — Z90710 Acquired absence of both cervix and uterus: Secondary | ICD-10-CM | POA: Diagnosis not present

## 2019-06-25 DIAGNOSIS — Z833 Family history of diabetes mellitus: Secondary | ICD-10-CM | POA: Diagnosis not present

## 2019-06-25 DIAGNOSIS — I340 Nonrheumatic mitral (valve) insufficiency: Secondary | ICD-10-CM | POA: Diagnosis not present

## 2019-06-25 DIAGNOSIS — R0902 Hypoxemia: Secondary | ICD-10-CM

## 2019-06-25 DIAGNOSIS — Z716 Tobacco abuse counseling: Secondary | ICD-10-CM | POA: Diagnosis not present

## 2019-06-25 DIAGNOSIS — I2109 ST elevation (STEMI) myocardial infarction involving other coronary artery of anterior wall: Principal | ICD-10-CM | POA: Diagnosis present

## 2019-06-25 DIAGNOSIS — F17200 Nicotine dependence, unspecified, uncomplicated: Secondary | ICD-10-CM | POA: Diagnosis not present

## 2019-06-25 DIAGNOSIS — I444 Left anterior fascicular block: Secondary | ICD-10-CM | POA: Diagnosis present

## 2019-06-25 DIAGNOSIS — Z6820 Body mass index (BMI) 20.0-20.9, adult: Secondary | ICD-10-CM

## 2019-06-25 DIAGNOSIS — R636 Underweight: Secondary | ICD-10-CM | POA: Diagnosis present

## 2019-06-25 DIAGNOSIS — I5041 Acute combined systolic (congestive) and diastolic (congestive) heart failure: Secondary | ICD-10-CM | POA: Diagnosis present

## 2019-06-25 DIAGNOSIS — J9600 Acute respiratory failure, unspecified whether with hypoxia or hypercapnia: Secondary | ICD-10-CM | POA: Diagnosis present

## 2019-06-25 DIAGNOSIS — Z8249 Family history of ischemic heart disease and other diseases of the circulatory system: Secondary | ICD-10-CM | POA: Diagnosis not present

## 2019-06-25 DIAGNOSIS — Z885 Allergy status to narcotic agent status: Secondary | ICD-10-CM

## 2019-06-25 DIAGNOSIS — K253 Acute gastric ulcer without hemorrhage or perforation: Secondary | ICD-10-CM | POA: Diagnosis not present

## 2019-06-25 DIAGNOSIS — J441 Chronic obstructive pulmonary disease with (acute) exacerbation: Secondary | ICD-10-CM | POA: Diagnosis not present

## 2019-06-25 DIAGNOSIS — R29702 NIHSS score 2: Secondary | ICD-10-CM | POA: Diagnosis present

## 2019-06-25 DIAGNOSIS — I2510 Atherosclerotic heart disease of native coronary artery without angina pectoris: Secondary | ICD-10-CM | POA: Diagnosis present

## 2019-06-25 DIAGNOSIS — I6340 Cerebral infarction due to embolism of unspecified cerebral artery: Secondary | ICD-10-CM

## 2019-06-25 DIAGNOSIS — I10 Essential (primary) hypertension: Secondary | ICD-10-CM | POA: Diagnosis not present

## 2019-06-25 DIAGNOSIS — I255 Ischemic cardiomyopathy: Secondary | ICD-10-CM | POA: Diagnosis present

## 2019-06-25 DIAGNOSIS — I110 Hypertensive heart disease with heart failure: Secondary | ICD-10-CM | POA: Diagnosis present

## 2019-06-25 DIAGNOSIS — J9601 Acute respiratory failure with hypoxia: Secondary | ICD-10-CM | POA: Diagnosis not present

## 2019-06-25 DIAGNOSIS — I639 Cerebral infarction, unspecified: Secondary | ICD-10-CM | POA: Diagnosis not present

## 2019-06-25 LAB — ECHOCARDIOGRAM COMPLETE
Height: 64 in
Weight: 1920 oz

## 2019-06-25 LAB — CBC WITH DIFFERENTIAL/PLATELET
Abs Immature Granulocytes: 0.15 10*3/uL — ABNORMAL HIGH (ref 0.00–0.07)
Basophils Absolute: 0 10*3/uL (ref 0.0–0.1)
Basophils Relative: 0 %
Eosinophils Absolute: 0.1 10*3/uL (ref 0.0–0.5)
Eosinophils Relative: 0 %
HCT: 39.4 % (ref 36.0–46.0)
Hemoglobin: 12.3 g/dL (ref 12.0–15.0)
Immature Granulocytes: 1 %
Lymphocytes Relative: 11 %
Lymphs Abs: 1.8 10*3/uL (ref 0.7–4.0)
MCH: 29.1 pg (ref 26.0–34.0)
MCHC: 31.2 g/dL (ref 30.0–36.0)
MCV: 93.4 fL (ref 80.0–100.0)
Monocytes Absolute: 0.5 10*3/uL (ref 0.1–1.0)
Monocytes Relative: 3 %
Neutro Abs: 13.7 10*3/uL — ABNORMAL HIGH (ref 1.7–7.7)
Neutrophils Relative %: 85 %
Platelets: 400 10*3/uL (ref 150–400)
RBC: 4.22 MIL/uL (ref 3.87–5.11)
RDW: 14.6 % (ref 11.5–15.5)
WBC: 16.2 10*3/uL — ABNORMAL HIGH (ref 4.0–10.5)
nRBC: 0 % (ref 0.0–0.2)

## 2019-06-25 LAB — COMPREHENSIVE METABOLIC PANEL
ALT: <5 U/L (ref 0–44)
AST: 17 U/L (ref 15–41)
Albumin: 3 g/dL — ABNORMAL LOW (ref 3.5–5.0)
Alkaline Phosphatase: 114 U/L (ref 38–126)
Anion gap: 14 (ref 5–15)
BUN: 12 mg/dL (ref 8–23)
CO2: 22 mmol/L (ref 22–32)
Calcium: 9.2 mg/dL (ref 8.9–10.3)
Chloride: 107 mmol/L (ref 98–111)
Creatinine, Ser: 0.79 mg/dL (ref 0.44–1.00)
GFR calc Af Amer: >60 mL/min (ref >60)
GFR calc non Af Amer: >60 mL/min (ref >60)
Glucose, Bld: 135 mg/dL — ABNORMAL HIGH (ref 70–99)
Potassium: 3.5 mmol/L (ref 3.5–5.1)
Sodium: 143 mmol/L (ref 135–145)
Total Bilirubin: 0.1 mg/dL — ABNORMAL LOW (ref 0.3–1.2)
Total Protein: 7.4 g/dL (ref 6.5–8.1)

## 2019-06-25 LAB — HEPARIN LEVEL (UNFRACTIONATED): Heparin Unfractionated: <0.1 IU/mL — ABNORMAL LOW (ref 0.30–0.70)

## 2019-06-25 LAB — TROPONIN I (HIGH SENSITIVITY)
Troponin I (High Sensitivity): 371 ng/L (ref <18)
Troponin I (High Sensitivity): 337 ng/L (ref <18)
Troponin I (High Sensitivity): 325 ng/L (ref <18)
Troponin I (High Sensitivity): 283 ng/L (ref <18)

## 2019-06-25 LAB — SARS CORONAVIRUS 2 BY RT PCR (HOSPITAL ORDER, PERFORMED IN ~~LOC~~ HOSPITAL LAB): SARS Coronavirus 2: NEGATIVE

## 2019-06-25 LAB — TSH: TSH: 7.231 u[IU]/mL — ABNORMAL HIGH (ref 0.350–4.500)

## 2019-06-25 LAB — BRAIN NATRIURETIC PEPTIDE: B Natriuretic Peptide: 649 pg/mL — ABNORMAL HIGH (ref 0.0–100.0)

## 2019-06-25 MED ORDER — METHYLPREDNISOLONE SODIUM SUCC 125 MG IJ SOLR
60.0000 mg | Freq: Two times a day (BID) | INTRAMUSCULAR | Status: DC
  Administered 2019-06-25 – 2019-06-26 (×2): 60 mg via INTRAVENOUS
  Filled 2019-06-25 (×2): qty 2

## 2019-06-25 MED ORDER — LEVOTHYROXINE SODIUM 88 MCG PO TABS
88.0000 ug | ORAL_TABLET | Freq: Every day | ORAL | Status: DC
  Administered 2019-06-26 – 2019-06-28 (×3): 88 ug via ORAL
  Filled 2019-06-25 (×3): qty 1

## 2019-06-25 MED ORDER — FUROSEMIDE 10 MG/ML IJ SOLN
60.0000 mg | Freq: Once | INTRAMUSCULAR | Status: AC
  Administered 2019-06-25: 60 mg via INTRAVENOUS
  Filled 2019-06-25: qty 6

## 2019-06-25 MED ORDER — NICOTINE 21 MG/24HR TD PT24
21.0000 mg | MEDICATED_PATCH | Freq: Every day | TRANSDERMAL | Status: DC
  Administered 2019-06-25 – 2019-06-29 (×5): 21 mg via TRANSDERMAL
  Filled 2019-06-25 (×5): qty 1

## 2019-06-25 MED ORDER — NITROGLYCERIN 2 % TD OINT
1.0000 [in_us] | TOPICAL_OINTMENT | Freq: Once | TRANSDERMAL | Status: DC
  Filled 2019-06-25: qty 1

## 2019-06-25 MED ORDER — ACETAMINOPHEN 325 MG PO TABS
650.0000 mg | ORAL_TABLET | ORAL | Status: DC | PRN
  Administered 2019-06-27 – 2019-06-28 (×2): 650 mg via ORAL
  Filled 2019-06-25 (×3): qty 2

## 2019-06-25 MED ORDER — IPRATROPIUM-ALBUTEROL 0.5-2.5 (3) MG/3ML IN SOLN: 3.0000 mL | Freq: Three times a day (TID) | RESPIRATORY_TRACT | Status: DC

## 2019-06-25 MED ORDER — ALBUTEROL SULFATE HFA 108 (90 BASE) MCG/ACT IN AERS
INHALATION_SPRAY | RESPIRATORY_TRACT | Status: AC
  Administered 2019-06-25: 8
  Filled 2019-06-25: qty 6.7

## 2019-06-25 MED ORDER — METHYLPREDNISOLONE SODIUM SUCC 125 MG IJ SOLR
125.0000 mg | Freq: Once | INTRAMUSCULAR | Status: AC
  Administered 2019-06-25: 06:00:00 125 mg via INTRAVENOUS

## 2019-06-25 MED ORDER — ONDANSETRON HCL 4 MG/2ML IJ SOLN: 4.0000 mg | Freq: Four times a day (QID) | INTRAMUSCULAR | Status: DC | PRN

## 2019-06-25 MED ORDER — POTASSIUM CHLORIDE CRYS ER 20 MEQ PO TBCR
20.0000 meq | EXTENDED_RELEASE_TABLET | Freq: Two times a day (BID) | ORAL | Status: DC
  Administered 2019-06-25 – 2019-06-27 (×4): 20 meq via ORAL
  Filled 2019-06-25 (×4): qty 1

## 2019-06-25 MED ORDER — ALBUTEROL (5 MG/ML) CONTINUOUS INHALATION SOLN
10.0000 mg/h | INHALATION_SOLUTION | Freq: Once | RESPIRATORY_TRACT | Status: AC
  Administered 2019-06-25: 10 mg/h via RESPIRATORY_TRACT
  Filled 2019-06-25: qty 20

## 2019-06-25 MED ORDER — ASPIRIN 325 MG PO TABS
325.0000 mg | ORAL_TABLET | Freq: Once | ORAL | Status: AC
  Administered 2019-06-25: 325 mg via ORAL
  Filled 2019-06-25: qty 1

## 2019-06-25 MED ORDER — HEPARIN (PORCINE) 25000 UT/250ML-% IV SOLN
1000.0000 [IU]/h | INTRAVENOUS | Status: DC
  Administered 2019-06-25: 650 [IU]/h via INTRAVENOUS
  Administered 2019-06-26: 850 [IU]/h via INTRAVENOUS
  Administered 2019-06-27: 900 [IU]/h via INTRAVENOUS
  Administered 2019-06-28: 1000 [IU]/h via INTRAVENOUS
  Filled 2019-06-25 (×3): qty 250

## 2019-06-25 MED ORDER — HEPARIN BOLUS VIA INFUSION
3250.0000 [IU] | Freq: Once | INTRAVENOUS | Status: AC
  Administered 2019-06-25: 3250 [IU] via INTRAVENOUS

## 2019-06-25 MED ORDER — MAGNESIUM SULFATE 2 GM/50ML IV SOLN
2.0000 g | Freq: Once | INTRAVENOUS | Status: AC
  Administered 2019-06-25: 2 g via INTRAVENOUS
  Filled 2019-06-25: qty 50

## 2019-06-25 MED ORDER — ASPIRIN EC 81 MG PO TBEC
81.0000 mg | DELAYED_RELEASE_TABLET | Freq: Every day | ORAL | Status: DC
  Administered 2019-06-26 – 2019-06-29 (×4): 81 mg via ORAL
  Filled 2019-06-25 (×4): qty 1

## 2019-06-25 MED ORDER — METHYLPREDNISOLONE SODIUM SUCC 125 MG IJ SOLR
INTRAMUSCULAR | Status: AC
  Administered 2019-06-25: 125 mg via INTRAVENOUS
  Filled 2019-06-25: qty 2

## 2019-06-25 MED ORDER — ALPRAZOLAM 0.25 MG PO TABS
0.2500 mg | ORAL_TABLET | Freq: Once | ORAL | Status: AC
  Administered 2019-06-25: 0.25 mg via ORAL
  Filled 2019-06-25: qty 1

## 2019-06-25 MED ORDER — ALBUTEROL SULFATE HFA 108 (90 BASE) MCG/ACT IN AERS: 8.0000 | INHALATION_SPRAY | Freq: Once | RESPIRATORY_TRACT | Status: DC

## 2019-06-25 MED ORDER — ATORVASTATIN CALCIUM 10 MG PO TABS
20.0000 mg | ORAL_TABLET | Freq: Every day | ORAL | Status: DC
  Administered 2019-06-25 – 2019-06-27 (×3): 20 mg via ORAL
  Filled 2019-06-25 (×3): qty 2

## 2019-06-25 MED ORDER — IPRATROPIUM-ALBUTEROL 0.5-2.5 (3) MG/3ML IN SOLN
3.0000 mL | Freq: Four times a day (QID) | RESPIRATORY_TRACT | Status: DC
  Administered 2019-06-25: 20:00:00 3 mL via RESPIRATORY_TRACT
  Filled 2019-06-25: qty 3

## 2019-06-25 MED ORDER — SODIUM CHLORIDE 0.9% FLUSH: 3.0000 mL | INTRAVENOUS | Status: DC | PRN

## 2019-06-25 MED ORDER — ALBUTEROL SULFATE (2.5 MG/3ML) 0.083% IN NEBU
5.0000 mg | INHALATION_SOLUTION | Freq: Once | RESPIRATORY_TRACT | Status: AC
  Administered 2019-06-25: 5 mg via RESPIRATORY_TRACT

## 2019-06-25 MED ORDER — FAMOTIDINE 20 MG PO TABS
20.0000 mg | ORAL_TABLET | Freq: Every day | ORAL | Status: DC
  Administered 2019-06-25 – 2019-06-29 (×5): 20 mg via ORAL
  Filled 2019-06-25 (×5): qty 1

## 2019-06-25 MED ORDER — SODIUM CHLORIDE 0.9% FLUSH
3.0000 mL | Freq: Two times a day (BID) | INTRAVENOUS | Status: DC
  Administered 2019-06-25 – 2019-06-28 (×5): 3 mL via INTRAVENOUS

## 2019-06-25 MED ORDER — FUROSEMIDE 10 MG/ML IJ SOLN
40.0000 mg | Freq: Two times a day (BID) | INTRAMUSCULAR | Status: DC
  Administered 2019-06-25 – 2019-06-27 (×4): 40 mg via INTRAVENOUS
  Filled 2019-06-25 (×4): qty 4

## 2019-06-25 MED ORDER — IPRATROPIUM-ALBUTEROL 0.5-2.5 (3) MG/3ML IN SOLN: 3.0000 mL | RESPIRATORY_TRACT | Status: DC | PRN

## 2019-06-25 MED ORDER — SODIUM CHLORIDE 0.9 % IV SOLN
250.0000 mL | INTRAVENOUS | Status: DC | PRN
  Administered 2019-06-26: 250 mL via INTRAVENOUS

## 2019-06-25 MED ORDER — HEPARIN BOLUS VIA INFUSION
1650.0000 [IU] | Freq: Once | INTRAVENOUS | Status: AC
  Administered 2019-06-25: 1650 [IU] via INTRAVENOUS
  Filled 2019-06-25: qty 1650

## 2019-06-25 NOTE — Progress Notes (Addendum)
ANTICOAGULATION CONSULT NOTE - Initial Consult  Pharmacy Consult for Heparin Indication: chest pain/ACS  Allergies  Allergen Reactions  . Brisdelle [Paroxetine Mesylate] Other (See Comments)    seizure  . Bee Venom Rash  . Hydrocodone Rash    Patient Measurements: Height: 5\' 4"  (162.6 cm) Weight: 120 lb (54.4 kg) IBW/kg (Calculated) : 54.7 HEPARIN DW (KG): 54.4  Vital Signs: Temp: 97.4 F (36.3 C) (08/21 0628) Temp Source: Axillary (08/21 0628) BP: 112/73 (08/21 0930) Pulse Rate: 103 (08/21 0930)  Labs: Recent Labs    06/25/19 0803  HGB 12.3  HCT 39.4  PLT 400  CREATININE 0.79  TROPONINIHS 371*    Estimated Creatinine Clearance: 63.4 mL/min (by C-G formula based on SCr of 0.79 mg/dL).   Medical History: Past Medical History:  Diagnosis Date  . Hyperlipidemia   . Hypertension   . Thyroid disease     Medications:  See med rec  Assessment: Patient presented to ED with SOB. Elevated troponin. Reviewed medications and not on NOACS. Pharmacy asked to start heparin  Goal of Therapy:  Heparin level 0.3-0.7 units/ml Monitor platelets by anticoagulation protocol: Yes   Plan:  Give 3250 units bolus x 1 Start heparin infusion at 650 units/hr Check anti-Xa level in ~6-8 hours and daily while on heparin Continue to monitor H&H and platelets  Isac Sarna, BS Vena Austria, BCPS Clinical Pharmacist Pager [phone] 06/25/2019,10:44 AM
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
## 2019-06-25 NOTE — ED Provider Notes (Signed)
Patient with congestive heart failure.  Cardiology has consulted and will follow the patient with recommendations of internal medicine admission to any [facility]   Milton Ferguson, MD 06/25/19 1218

## 2019-06-25 NOTE — ED Provider Notes (Addendum)
[facility] EMERGENCY DEPARTMENT Provider Note   CSN: [id number] Arrival date & time: 06/25/19  3329   Time seen on arrival  History   Chief Complaint Chief Complaint  Patient presents with  . Shortness of Breath   Level 5 caveat for respiratory distress  HPI Annette Morrison is a 63 y.o. female.     HPI patient reports she started feeling bad 3 weeks ago with coughing, wheezing, shortness of breath, and fevers.  She denies any swelling of her legs.  She states last night it got worse.  Patient does states she smokes about a pack a day for 50 years.  She is never been diagnosed with a heart or lung problem.  EMS reports her pulse ox was 80% on room air.  They put her on a nonrebreather and her pulse ox was 83%.  They did not give her any medications.  PCP Patient, No Pcp Per   Past Medical History:  Diagnosis Date  . Hyperlipidemia   . Hypertension   . Thyroid disease     Patient Active Problem List   Diagnosis Date Noted  . Hyperlipidemia 08/26/2018  . Hypothyroidism 08/26/2018  . Essential hypertension 07/29/2018  . Mammogram declined 07/29/2018  . Tobacco use disorder 07/29/2018    Past Surgical History:  Procedure Laterality Date  . ABDOMINAL HYSTERECTOMY       OB History   No obstetric history on file.      Home Medications    Prior to Admission medications   Medication Sig Start Date End Date Taking? Authorizing Provider  atorvastatin (LIPITOR) 20 MG tablet Take 1 tablet (20 mg total) by mouth daily. 08/26/18   Soyla Dryer, PA-C  doxycycline (VIBRAMYCIN) 100 MG capsule Take 1 capsule (100 mg total) by mouth 2 (two) times daily. 03/11/19   Hayden Rasmussen, MD  famotidine (PEPCID) 20 MG tablet Take 1 tablet (20 mg total) by mouth daily. 03/11/19   Hayden Rasmussen, MD  levothyroxine (SYNTHROID, LEVOTHROID) 88 MCG tablet Take 1 tablet (88 mcg total) by mouth daily. 08/26/18   Soyla Dryer, PA-C  losartan (COZAAR) 100 MG tablet Take 1 tablet (100  mg total) by mouth daily. 03/11/19   Hayden Rasmussen, MD    Family History Family History  Problem Relation Age of Onset  . Diabetes Mother   . Heart disease Father     Social History Social History   Tobacco Use  . Smoking status: Current Every Day Smoker    Packs/day: 1.00    Years: 48.00    Pack years: 48.00  . Smokeless tobacco: Never Used  Substance Use Topics  . Alcohol use: Not Currently    Frequency: Never  . Drug use: Never     Allergies   Brisdelle [paroxetine mesylate], Bee venom, and Hydrocodone   Review of Systems Review of Systems  Unable to perform ROS: Severe respiratory distress     Physical Exam Updated Vital Signs BP (!) 139/105 (BP Location: Left Arm)   Pulse (!) 105   Temp (!) 97.4 F (36.3 C) (Axillary)   Resp (!) 28   Ht 5\' 4"  (1.626 m)   Wt 54.4 kg   SpO2 100%   BMI 20.60 kg/m   Vital signs normal except for tachycardia   Physical Exam Vitals signs and nursing note reviewed.  Constitutional:      General: She is not in acute distress.    Appearance: Normal appearance. She is well-developed. She is not ill-appearing or  toxic-appearing.  HENT:     Head: Normocephalic and atraumatic.     Right Ear: External ear normal.     Left Ear: External ear normal.     Nose: Nose normal. No mucosal edema or rhinorrhea.     Mouth/Throat:     Dentition: No dental abscesses.     Pharynx: No uvula swelling.     Comments: Patient has a nonrebreather mask in place Eyes:     Extraocular Movements: Extraocular movements intact.     Conjunctiva/sclera: Conjunctivae normal.     Pupils: Pupils are equal, round, and reactive to light.  Neck:     Musculoskeletal: Full passive range of motion without pain, normal range of motion and neck supple.  Cardiovascular:     Rate and Rhythm: Normal rate and regular rhythm.     Heart sounds: Normal heart sounds. No murmur. No friction rub. No gallop.   Pulmonary:     Effort: Tachypnea, accessory muscle  usage, prolonged expiration, respiratory distress and retractions present.     Breath sounds: Decreased breath sounds present. No wheezing, rhonchi or rales.  Chest:     Chest wall: No tenderness or crepitus.  Abdominal:     General: Bowel sounds are normal. There is no distension.     Palpations: Abdomen is soft.     Tenderness: There is no abdominal tenderness. There is no guarding or rebound.  Musculoskeletal: Normal range of motion.        General: No tenderness.     Right lower leg: No edema.     Left lower leg: No edema.     Comments: Moves all extremities well.   Skin:    General: Skin is warm and dry.     Coloration: Skin is not pale.     Findings: No erythema or rash.  Neurological:     General: No focal deficit present.     Mental Status: She is alert and oriented to person, place, and time.     Cranial Nerves: No cranial nerve deficit.  Psychiatric:        Mood and Affect: Mood is anxious.        Speech: Speech is rapid and pressured.        Behavior: Behavior is cooperative.     Comments: Patient cannot speak in complete sentences      ED Treatments / Results  Labs (all labs ordered are listed, but only abnormal results are displayed)  Results for orders placed or performed during the hospital encounter of 06/25/19  SARS Coronavirus 2 [facility](Hospital order, Performed [facility]) Nasopharyngeal Nasopharyngeal Swab   Specimen: Nasopharyngeal Swab  Result Value Ref Range   SARS Coronavirus 2 NEGATIVE NEGATIVE    Laboratory interpretation all normal except  Blood work pending    EKG EKG Interpretation  Date/Time:  Friday June 25 2019 06:05:57 EDT Ventricular Rate:  136 PR Interval:    QRS Duration: 77 QT Interval:  289 QTC Calculation: 435 R Axis:   -60 Text Interpretation:  Sinus tachycardia Atrial premature complex Left anterior fascicular block Low voltage, precordial leads Probable anteroseptal infarct, old Artifact in lead(s) I II III  aVR aVL aVF V4 V6 Since last tracing rate faster Confirmed by Devoria AlbeKnapp, Ashaki Frosch ([id number]) on 06/25/2019 6:57:28 AM   Radiology Dg Chest Portable 1 View  Result Date: 06/25/2019 CLINICAL DATA:  Shortness of breath, fever, cough. EXAM: PORTABLE CHEST 1 VIEW COMPARISON:  Radiograph of Mar 11, 2019. FINDINGS: The heart  size and mediastinal contours are within normal limits. No pneumothorax is noted. Increased diffuse interstitial densities are noted throughout both lungs consistent with pulmonary edema. Minimal pleural effusions are noted. The visualized skeletal structures are unremarkable. IMPRESSION: Findings most consistent with bilateral pulmonary edema and minimal pleural effusions. Electronically Signed   By: Lupita RaiderJames  Green Jr M.D.   On: 06/25/2019 06:27    Procedures .Critical Care Performed by: Devoria AlbeKnapp, Anida Deol, MD Authorized by: Devoria AlbeKnapp, Moncia Annas, MD   Critical care provider statement:    Critical care time (minutes):  38   Critical care was necessary to treat or prevent imminent or life-threatening deterioration of the following conditions:  Respiratory failure   Critical care was time spent personally by me on the following activities:  Examination of patient, obtaining history from patient or surrogate, ordering and review of laboratory studies, ordering and review of radiographic studies, pulse oximetry, re-evaluation of patient's condition and review of old charts   (including critical care time)  Medications Ordered in ED Medications  furosemide (LASIX) injection 60 mg (has no administration in time range)  nitroGLYCERIN (NITROGLYN) 2 % ointment 1 inch (has no administration in time range)  albuterol (PROVENTIL,VENTOLIN) solution continuous neb (has no administration in time range)  albuterol (PROVENTIL) (2.5 MG/3ML) 0.083% nebulizer solution 5 mg (5 mg Inhalation Given 06/25/19 0621)  magnesium sulfate IVPB 2 g 50 mL (2 g Intravenous New Bag/Given 06/25/19 [id number])  methylPREDNISolone sodium succinate  (SOLU-MEDROL) 125 mg/2 mL injection 125 mg (125 mg Intravenous Given 06/25/19 0612)  albuterol (VENTOLIN HFA) 108 (90 Base) MCG/ACT inhaler (8 puffs  Given 06/25/19 [id number])     Initial Impression / Assessment and Plan / ED Course  I have reviewed the triage vital signs and the nursing notes.  Pertinent labs & imaging results that were available during my care of the patient were reviewed by me and considered in my medical decision making (see chart for details).      Because of patient's smoking history and her lack of lungs sounds she was given IV Solu-Medrol, IV magnesium after checking her last BUN and creatinine which were normal, and she was given albuterol 8 puffs by inhaler.  Laboratory testing including COVID testing and portable chest x-ray was ordered.  After reviewing her chest x-ray she was given Lasix IV nitroglycerin paste.  Check at 7 AM patient appears to be much more comfortable.  She seems much more relaxed.  She states her breathing is much better.  When I listen to her now I am able to hear breath sounds in her upper lungs.  I still do not hear a lot in her lower lungs.  Her albuterol inhaler 8 puffs was repeated.  Patient's laboratory testings have not resulted yet.  Patient was turned over to Dr. Estell HarpinZammit at change of shift to get her blood work and make her disposition.  7:20 AM patient's COVID has resulted and is negative.  Her albuterol inhaler was changed to albuterol nebulizer.  Final Clinical Impressions(s) / ED Diagnoses   Final diagnoses:  Hypoxia  COPD exacerbation (HCC)    Disposition pending  Devoria AlbeIva Jontay Maston, MD, Concha PyoFACEP    William Laske, MD 06/25/19 [id number]    Devoria AlbeKnapp, Sativa Gelles, MD 06/25/19 [phone]

## 2019-06-25 NOTE — Progress Notes (Signed)
*  PRELIMINARY RESULTS* Echocardiogram 2D Echocardiogram has been performed.  Annette Morrison 06/25/2019, 10:44 AM

## 2019-06-25 NOTE — Progress Notes (Signed)
[facility] for Heparin Indication: chest pain/ACS  Allergies  Allergen Reactions  . Brisdelle [Paroxetine Mesylate] Other (See Comments)    seizure  . Bee Venom Rash  . Hydrocodone Rash    Patient Measurements: Height: 5\' 4"  (162.6 cm) Weight: 120 lb (54.4 kg) IBW/kg (Calculated) : 54.7 HEPARIN DW (KG): 54.4  Vital Signs: BP: 101/71 (08/21 1530) Pulse Rate: 94 (08/21 1605)  Labs: Recent Labs    06/25/19 0803 06/25/19 1031 06/25/19 1901  HGB 12.3  --   --   HCT 39.4  --   --   PLT 400  --   --   HEPARINUNFRC  --   --  <0.10*  CREATININE 0.79  --   --   TROPONINIHS 371* 337*  --     Estimated Creatinine Clearance: 63.4 mL/min (by C-G formula based on SCr of 0.79 mg/dL).   Assessment: Patient presented to ED with SOB. Elevated troponin. Reviewed medications and not on NOACS. Pharmacy asked to start heparin. Plan is for L/R cath once stable.  Heparin level is undetectable at <0.1 on 650 units/hr. No bleeding noted. No problems with infusion or bleeding per RN.  Goal of Therapy:  Heparin level 0.3-0.7 units/ml Monitor platelets by anticoagulation protocol: Yes   Plan:  Heparin 1650 unit IV bolus then increase to 850 units/hr 8 hr heparin level with am labs Daily heparin level and CBC Monitor for s/sx of bleeding   Thank you for involving pharmacy in this patient's care.  Renold Genta, PharmD, BCPS Clinical Pharmacist Clinical phone for 06/25/2019 until 21:30p is [phone] 06/25/2019 8:15 PM  **Pharmacist phone directory can be found on [email or web address] listed under [place]**

## 2019-06-25 NOTE — H&P (Signed)
History and Physical    Annette Morrison OQH:[id number] DOB: Jan 23, 1956 DOA: 06/25/2019  PCP: Patient, No Pcp Per  Patient coming from: Home  I have personally briefly reviewed patient's old medical records in [facility]  Chief Complaint: Shortness of breath  HPI: Annette Morrison is a 63 y.o. female with medical history significant of hypertension, hyperlipidemia, hypothyroidism, tobacco use, presents to the hospital with complaints of progressive shortness of breath.  She reports having intermittent shortness of breath for the past 2 to 3 weeks.  This will typically get worse on exertion.  She has not noticed any lower extremity edema.  Her breathing is worse when she lays down flat, improves when she sits up.  She is not had any significant cough or fever.  Last night, she developed significant shortness of breath with associated substernal chest pain.  This was nonradiating discomfort without any associated diaphoresis.  She has not had any nausea or vomiting, no diarrhea.  She did have lightheadedness on exertion.  She also describes experiencing episodes of slurred speech and difficulty finding words which have been intermittent over the last several weeks.  She denies any blurry vision, difficulty swallowing, unilateral weakness or numbness.  When her shortness of breath persisted, she called EMS who found her to be significantly hypoxic on room air.  She was placed on a nonrebreather and brought to the emergency room.  ED Course: On arrival to the emergency room, she was noted to be tachycardic with increased work of breathing.  Chest x-ray indicated CHF.  She also had diminished breath sounds bilaterally.  Patient received intravenous steroids, bronchodilators and intravenous Lasix.  Troponin returned mildly elevated at 371, BNP 649, EKG with nonspecific changes.  Patient was seen by cardiology and underwent echocardiogram in the emergency room that showed depressed ejection fraction of 40 to  45% along with wall motion abnormalities.  It was felt that she would likely need cardiac catheterization when she was optimally diuresis.  Review of Systems: As per HPI otherwise 10 point review of systems negative.    Past Medical History:  Diagnosis Date   Hyperlipidemia    Hypertension    Thyroid disease     Past Surgical History:  Procedure Laterality Date   ABDOMINAL HYSTERECTOMY      Social History:  reports that she has been smoking. She has a 48.00 pack-year smoking history. She has never used smokeless tobacco. She reports previous alcohol use. She reports that she does not use drugs.  Allergies  Allergen Reactions   Brisdelle [Paroxetine Mesylate] Other (See Comments)    seizure   Bee Venom Rash   Hydrocodone Rash    Family History  Problem Relation Age of Onset   Diabetes Mother    Heart disease Father        Died of MI at age 78   CAD Sister     Prior to Admission medications   Medication Sig Start Date End Date Taking? Authorizing Provider  atorvastatin (LIPITOR) 20 MG tablet Take 1 tablet (20 mg total) by mouth daily. 08/26/18   Soyla Dryer, PA-C  famotidine (PEPCID) 20 MG tablet Take 1 tablet (20 mg total) by mouth daily. 03/11/19   Hayden Rasmussen, MD  levothyroxine (SYNTHROID, LEVOTHROID) 88 MCG tablet Take 1 tablet (88 mcg total) by mouth daily. 08/26/18   Soyla Dryer, PA-C  losartan (COZAAR) 100 MG tablet Take 1 tablet (100 mg total) by mouth daily. 03/11/19   Hayden Rasmussen, MD  Physical Exam: Vitals:   06/25/19 1045 06/25/19 1100 06/25/19 1115 06/25/19 1130  BP:  113/72  110/76  Pulse: 98 (!) 101 (!) 102 (!) 103  Resp: (!) 21 19 17  (!) 21  Temp:      TempSrc:      SpO2: 98% 97% 97% 97%  Weight:      Height:        Constitutional: NAD, calm, comfortable Eyes: PERRL, lids and conjunctivae normal ENMT: Mucous membranes are moist. Posterior pharynx clear of any exudate or lesions.Normal dentition.  Neck: normal,  supple, no masses, no thyromegaly Respiratory: Crackles at bases with scattered wheezes. Normal respiratory effort. No accessory muscle use.  Becomes short of breath during conversation Cardiovascular: Regular rate and rhythm, no murmurs / rubs / gallops. No extremity edema. 2+ pedal pulses. No carotid bruits.  Abdomen: no tenderness, no masses palpated. No hepatosplenomegaly. Bowel sounds positive.  Musculoskeletal: no clubbing / cyanosis. No joint deformity upper and lower extremities. Good ROM, no contractures. Normal muscle tone.  Skin: no rashes, lesions, ulcers. No induration Neurologic: CN 2-12 grossly intact. Sensation intact, DTR normal. Strength 4/5 in all 4.  Psychiatric: Normal judgment and insight. Alert and oriented x 3. Normal mood.    Labs on Admission: I have personally reviewed following labs and imaging studies  CBC: Recent Labs  Lab 06/25/19 0803  WBC 16.2*  NEUTROABS 13.7*  HGB 12.3  HCT 39.4  MCV 93.4  PLT 400   Basic Metabolic Panel: Recent Labs  Lab 06/25/19 0803  NA 143  K 3.5  CL 107  CO2 22  GLUCOSE 135*  BUN 12  CREATININE 0.79  CALCIUM 9.2   GFR: Estimated Creatinine Clearance: 63.4 mL/min (by C-G formula based on SCr of 0.79 mg/dL). Liver Function Tests: Recent Labs  Lab 06/25/19 0803  AST 17  ALT <5  ALKPHOS 114  BILITOT 0.1*  PROT 7.4  ALBUMIN 3.0*   No results for input(s): LIPASE, AMYLASE in the last 168 hours. No results for input(s): AMMONIA in the last 168 hours. Coagulation Profile: No results for input(s): INR, PROTIME in the last 168 hours. Cardiac Enzymes: No results for input(s): CKTOTAL, CKMB, CKMBINDEX, TROPONINI in the last 168 hours. BNP (last 3 results) No results for input(s): PROBNP in the last 8760 hours. HbA1C: No results for input(s): HGBA1C in the last 72 hours. CBG: No results for input(s): GLUCAP in the last 168 hours. Lipid Profile: No results for input(s): CHOL, HDL, LDLCALC, TRIG, CHOLHDL,  LDLDIRECT in the last 72 hours. Thyroid Function Tests: Recent Labs    06/25/19 1037  TSH 7.231*   Anemia Panel: No results for input(s): VITAMINB12, FOLATE, FERRITIN, TIBC, IRON, RETICCTPCT in the last 72 hours. Urine analysis:    Component Value Date/Time   COLORURINE YELLOW 03/11/2019 2149   APPEARANCEUR HAZY (A) 03/11/2019 2149   LABSPEC 1.015 03/11/2019 2149   PHURINE 7.0 03/11/2019 2149   GLUCOSEU NEGATIVE 03/11/2019 2149   HGBUR MODERATE (A) 03/11/2019 2149   BILIRUBINUR NEGATIVE 03/11/2019 2149   KETONESUR NEGATIVE 03/11/2019 2149   PROTEINUR 30 (A) 03/11/2019 2149   NITRITE NEGATIVE 03/11/2019 2149   LEUKOCYTESUR NEGATIVE 03/11/2019 2149    Radiological Exams on Admission: Ct Head Wo Contrast  Result Date: 06/25/2019 CLINICAL DATA:  Reports her Synthroid was titrated approximately 3 weeks ago and following this she started to notice episodes of slurred speech and thought this might be related to her medication adjustment. EXAM: CT HEAD WITHOUT CONTRAST TECHNIQUE: Contiguous axial  images were obtained from the base of the skull through the vertex without intravenous contrast. FINDINGS: Brain: No evidence of acute infarction, hemorrhage, hydrocephalus, extra-axial collection or mass lesion/mass effect. Vascular: No hyperdense vessel or unexpected calcification. Skull: Normal. Negative for fracture or focal lesion. Sinuses/Orbits: No acute finding. Other: None. IMPRESSION: Negative exam. Electronically Signed   By: Norva PavlovElizabeth  Brown M.D.   On: 06/25/2019 14:07   Dg Chest Portable 1 View  Result Date: 06/25/2019 CLINICAL DATA:  Shortness of breath, fever, cough. EXAM: PORTABLE CHEST 1 VIEW COMPARISON:  Radiograph of Mar 11, 2019. FINDINGS: The heart size and mediastinal contours are within normal limits. No pneumothorax is noted. Increased diffuse interstitial densities are noted throughout both lungs consistent with pulmonary edema. Minimal pleural effusions are noted. The  visualized skeletal structures are unremarkable. IMPRESSION: Findings most consistent with bilateral pulmonary edema and minimal pleural effusions. Electronically Signed   By: Lupita RaiderJames  Green Jr M.D.   On: 06/25/2019 06:27    EKG: Independently reviewed.  Sinus tach with nonspecific ST changes.  Assessment/Plan Active Problems:   Essential hypertension   Tobacco use disorder   Hyperlipidemia   Hypothyroidism   Acute respiratory failure (HCC)   COPD with acute exacerbation (HCC)   Acute systolic CHF (congestive heart failure) (HCC)   Slurred speech     1. Acute respiratory failure with hypoxia.  Secondary to CHF exacerbation as well as COPD.  Patient initially presented with respiratory distress requiring nonrebreather.  Overall respiratory status has since improved with treatments.  She continues to become short of breath during conversation.  We will continue to wean down oxygen as tolerated. 2. Acute systolic CHF.  EF 40 to 45% per echocardiogram.  Multiple wall motion abnormalities.  Continue on aspirin.  Started on intravenous Lasix twice daily.  Will hold off on beta-blockers and ACE inhibitors as blood pressure is borderline.  Seen by cardiology and will likely need cardiac catheterization to further evaluate coronary anatomy.  This will need to be done, likely after the weekend when she is medically optimized. 3. Slurring of speech.  Unclear etiology.  CT head unrevealing.  Discussed with cardiology and she will need to be cleared from a neuro standpoint prior to any cardiac catheterization.  It does not appear that she is currently stable enough to lay flat for a prolonged period of time for MRI.  This can likely be done in the next 24 hours as she diuresis.  If any significant findings on MRI, may need to have neurology input.  Since [facility] services are limited over the weekend at [facility], she will be transferred [facility] further work-up. 4. Hyperlipidemia.  Continue on statin  and check lipid panel. 5. COPD exacerbation.  Started on intravenous steroids and bronchodilators. 6. Hypothyroidism.  Continue Synthroid 7. Hypertension.  She is chronically on Cozaar.  Is currently on hold due to borderline blood pressures.  DVT prophylaxis: Heparin infusion Code Status: Full code Family Communication: At patient's request, I have updated her neighbor, Dorann OuKenneth Tolbert on her condition.  She has requested that Mr. Tolbert be her primary decision-maker in the event that she cannot make decisions for herself.  Will ask chaplain to assist with healthcare power of attorney paperwork. Disposition Plan: [facility] Consults called: Cardiology Admission status: Inpatient, progressive care  Erick BlinksJehanzeb Medha Pippen MD [facility]   If 7PM-7AM, please contact night-coverage [email or web address]   06/25/2019, 2:51 PM

## 2019-06-25 NOTE — ED Triage Notes (Signed)
Pt reports sob x 3 weeks, states has had a fever and a cough as well.

## 2019-06-25 NOTE — ED Notes (Signed)
CRITICAL VALUE ALERT  Critical Value:  Troponin 371  Date & Time Notied:  0/21/2020, 0907  Provider Notified: Dr. Roderic Palau  Orders Received/Actions taken:see chart

## 2019-06-26 ENCOUNTER — Inpatient Hospital Stay (HOSPITAL_COMMUNITY): Payer: Medicaid Other

## 2019-06-26 DIAGNOSIS — I5041 Acute combined systolic (congestive) and diastolic (congestive) heart failure: Secondary | ICD-10-CM

## 2019-06-26 DIAGNOSIS — E782 Mixed hyperlipidemia: Secondary | ICD-10-CM

## 2019-06-26 DIAGNOSIS — I10 Essential (primary) hypertension: Secondary | ICD-10-CM

## 2019-06-26 LAB — CBC
HCT: 36.8 % (ref 36.0–46.0)
Hemoglobin: 12.2 g/dL (ref 12.0–15.0)
MCH: 29.5 pg (ref 26.0–34.0)
MCHC: 33.2 g/dL (ref 30.0–36.0)
MCV: 89.1 fL (ref 80.0–100.0)
Platelets: 385 10*3/uL (ref 150–400)
RBC: 4.13 MIL/uL (ref 3.87–5.11)
RDW: 14.4 % (ref 11.5–15.5)
WBC: 12.1 10*3/uL — ABNORMAL HIGH (ref 4.0–10.5)
nRBC: 0 % (ref 0.0–0.2)

## 2019-06-26 LAB — HEPARIN LEVEL (UNFRACTIONATED)
Heparin Unfractionated: 0.34 IU/mL (ref 0.30–0.70)
Heparin Unfractionated: 0.16 IU/mL — ABNORMAL LOW (ref 0.30–0.70)
Heparin Unfractionated: 0.3 IU/mL (ref 0.30–0.70)

## 2019-06-26 LAB — BASIC METABOLIC PANEL
Anion gap: 14 (ref 5–15)
BUN: 12 mg/dL (ref 8–23)
CO2: 23 mmol/L (ref 22–32)
Calcium: 9.5 mg/dL (ref 8.9–10.3)
Chloride: 102 mmol/L (ref 98–111)
Creatinine, Ser: 0.78 mg/dL (ref 0.44–1.00)
GFR calc Af Amer: >60 mL/min (ref >60)
GFR calc non Af Amer: >60 mL/min (ref >60)
Glucose, Bld: 125 mg/dL — ABNORMAL HIGH (ref 70–99)
Potassium: 4.4 mmol/L (ref 3.5–5.1)
Sodium: 139 mmol/L (ref 135–145)

## 2019-06-26 LAB — HIV ANTIBODY (ROUTINE TESTING W REFLEX): HIV Screen 4th Generation wRfx: NONREACTIVE

## 2019-06-26 MED ORDER — METHYLPREDNISOLONE SODIUM SUCC 40 MG IJ SOLR
40.0000 mg | Freq: Two times a day (BID) | INTRAMUSCULAR | Status: DC
  Administered 2019-06-26 – 2019-06-27 (×2): 40 mg via INTRAVENOUS
  Filled 2019-06-26 (×2): qty 1

## 2019-06-26 MED ORDER — HYDROCODONE-ACETAMINOPHEN 5-325 MG PO TABS
1.0000 | ORAL_TABLET | Freq: Four times a day (QID) | ORAL | Status: DC | PRN
  Administered 2019-06-26: 1 via ORAL
  Filled 2019-06-26: qty 1

## 2019-06-26 MED ORDER — IPRATROPIUM-ALBUTEROL 0.5-2.5 (3) MG/3ML IN SOLN
3.0000 mL | Freq: Three times a day (TID) | RESPIRATORY_TRACT | Status: DC
  Administered 2019-06-26 (×3): 3 mL via RESPIRATORY_TRACT
  Filled 2019-06-26 (×3): qty 3

## 2019-06-26 MED ORDER — BISOPROLOL FUMARATE 5 MG PO TABS
2.5000 mg | ORAL_TABLET | Freq: Every day | ORAL | Status: DC
  Administered 2019-06-26 – 2019-06-29 (×4): 2.5 mg via ORAL
  Filled 2019-06-26 (×4): qty 1

## 2019-06-26 NOTE — Progress Notes (Signed)
PROGRESS NOTE    Annette Morrison  XVQ:[id number] DOB: 05/12/1956 DOA: 06/25/2019 PCP: Patient, No Pcp Per  Brief Narrative: 63 year old female history of hypertension dyslipidemia tobacco abuse COPD, hypothyroidism presented to the emergency room with progressive dyspnea on exertion for 2 to 3 weeks worsened the night prior to admission, chest x-ray indicated CHF, she was also noted to be wheezing, on work-up noted to have mildly elevated troponin with nonspecific EKG, seen by cardiology 2D echo in the emergency room showed depressed EF of 40% with new wall motion abnormalities, started on diuretics, IV heparin and transferred to [place]:   Acute respiratory failure with hypoxia -Primarily from CHF and COPD exacerbation -As below  Acute systolic CHF -EF 40 to 09% with multiple new wall motion abnormalities -Continue aspirin, IV Lasix today, started on low-dose bisoprolol -Cardiology following, plan for left heart catheterization on Monday  Mild COPD exacerbation -Was started on steroids and nebulizations on admission yesterday -No wheezing at this time will cut down IV Solu-Medrol   Slurred speech with word finding difficulty started 2 to 3 weeks ago -CT was unrevealing, much improved now -We will check MRI brain, she has risk factors for small vessel stroke -Continue aspirin  Dyslipidemia -Continue statin  Hypothyroidism -Continue Synthroid  DVT prophylaxis: Heparin infusion Code Status: Full code Family Communication:  No family at bedside Disposition Plan:  Home pending above work-up  Consultants:   Cardiology   Procedures:   Antimicrobials:    Subjective: -Breathing better, still has dyspnea with exertion but considerably improved from yesterday  Objective: Vitals:   06/26/19 0249 06/26/19 0449 06/26/19 0836 06/26/19 0849  BP: 115/81 114/86  115/82  Pulse: 88 90  (!) 108  Resp: (!) 21 17  (!) 24  Temp: 98 F (36.7 C) 98.4  F (36.9 C)    TempSrc: Oral Oral    SpO2: 99% 99% 97% 92%  Weight:  53.9 kg    Height:        Intake/Output Summary (Last 24 hours) at 06/26/2019 1204 Last data filed at 06/26/2019 3267 Gross per 24 hour  Intake 480.64 ml  Output 1250 ml  Net -769.36 ml   Filed Weights   06/25/19 0629 06/26/19 0449  Weight: 54.4 kg 53.9 kg    Examination:  General exam: AAO x3, frail, no distress Respiratory system: Poor air movement bilaterally, few basilar crackles no expiratory wheezes Cardiovascular system: S1 & S2 heard, RRR.  Gastrointestinal system: Abdomen is nondistended, soft and nontender.Normal bowel sounds heard. Central nervous system: Alert and oriented. No focal neurological deficits. Extremities: No edema Skin: No rashes, lesions or ulcers Psychiatry: Judgement and insight appear normal. Mood & affect appropriate.     Data Reviewed:   CBC: Recent Labs  Lab 06/25/19 0803 06/26/19 0608  WBC 16.2* 12.1*  NEUTROABS 13.7*  --   HGB 12.3 12.2  HCT 39.4 36.8  MCV 93.4 89.1  PLT 400 124   Basic Metabolic Panel: Recent Labs  Lab 06/25/19 0803 06/26/19 0225  NA 143 139  K 3.5 4.4  CL 107 102  CO2 22 23  GLUCOSE 135* 125*  BUN 12 12  CREATININE 0.79 0.78  CALCIUM 9.2 9.5   GFR: Estimated Creatinine Clearance: 62.8 mL/min (by C-G formula based on SCr of 0.78 mg/dL). Liver Function Tests: Recent Labs  Lab 06/25/19 0803  AST 17  ALT <5  ALKPHOS 114  BILITOT 0.1*  PROT 7.4  ALBUMIN 3.0*   No  results for input(s): LIPASE, AMYLASE in the last 168 hours. No results for input(s): AMMONIA in the last 168 hours. Coagulation Profile: No results for input(s): INR, PROTIME in the last 168 hours. Cardiac Enzymes: No results for input(s): CKTOTAL, CKMB, CKMBINDEX, TROPONINI in the last 168 hours. BNP (last 3 results) No results for input(s): PROBNP in the last 8760 hours. HbA1C: No results for input(s): HGBA1C in the last 72 hours. CBG: No results for  input(s): GLUCAP in the last 168 hours. Lipid Profile: No results for input(s): CHOL, HDL, LDLCALC, TRIG, CHOLHDL, LDLDIRECT in the last 72 hours. Thyroid Function Tests: Recent Labs    06/25/19 1037  TSH 7.231*   Anemia Panel: No results for input(s): VITAMINB12, FOLATE, FERRITIN, TIBC, IRON, RETICCTPCT in the last 72 hours. Urine analysis:    Component Value Date/Time   COLORURINE YELLOW 03/11/2019 2149   APPEARANCEUR HAZY (A) 03/11/2019 2149   LABSPEC 1.015 03/11/2019 2149   PHURINE 7.0 03/11/2019 2149   GLUCOSEU NEGATIVE 03/11/2019 2149   HGBUR MODERATE (A) 03/11/2019 2149   BILIRUBINUR NEGATIVE 03/11/2019 2149   KETONESUR NEGATIVE 03/11/2019 2149   PROTEINUR 30 (A) 03/11/2019 2149   NITRITE NEGATIVE 03/11/2019 2149   LEUKOCYTESUR NEGATIVE 03/11/2019 2149   Sepsis Labs: @LABRCNTIP (procalcitonin:4,lacticidven:4)  ) Recent Results (from the past 240 hour(s))  SARS Coronavirus 2 [facility](Hospital order, Performed in [facility] hospital lab) Nasopharyngeal Nasopharyngeal Swab     Status: None   Collection Time: 06/25/19  6:09 AM   Specimen: Nasopharyngeal Swab  Result Value Ref Range Status   SARS Coronavirus 2 NEGATIVE NEGATIVE Final    Comment: (NOTE) If result is NEGATIVE SARS-CoV-2 target nucleic acids are NOT DETECTED. The SARS-CoV-2 RNA is generally detectable in upper and lower  respiratory specimens during the acute phase of infection. The lowest  concentration of SARS-CoV-2 viral copies this assay can detect is 250  copies / mL. A negative result does not preclude SARS-CoV-2 infection  and should not be used as the sole basis for treatment or other  patient management decisions.  A negative result may occur with  improper specimen collection / handling, submission of specimen other  than nasopharyngeal swab, presence of viral mutation(s) within the  areas targeted by this assay, and inadequate number of viral copies  (<250 copies / mL). A negative result must be  combined with clinical  observations, patient history, and epidemiological information. If result is POSITIVE SARS-CoV-2 target nucleic acids are DETECTED. The SARS-CoV-2 RNA is generally detectable in upper and lower  respiratory specimens dur ing the acute phase of infection.  Positive  results are indicative of active infection with SARS-CoV-2.  Clinical  correlation with patient history and other diagnostic information is  necessary to determine patient infection status.  Positive results do  not rule out bacterial infection or co-infection with other viruses. If result is PRESUMPTIVE POSTIVE SARS-CoV-2 nucleic acids MAY BE PRESENT.   A presumptive positive result was obtained on the submitted specimen  and confirmed on repeat testing.  While 2019 novel coronavirus  (SARS-CoV-2) nucleic acids may be present in the submitted sample  additional confirmatory testing may be necessary for epidemiological  and / or clinical management purposes  to differentiate between  SARS-CoV-2 and other Sarbecovirus currently known to infect humans.  If clinically indicated additional testing with an alternate test  methodology [phone] is advised. The SARS-CoV-2 RNA is generally  detectable in upper and lower respiratory sp ecimens during the acute  phase of infection. The expected  result is Negative. Fact Sheet for Patients:  BoilerBrush.com.cyhttps://www.fda.gov/media/136312/download Fact Sheet for Healthcare Providers: https://pope.com/https://www.fda.gov/media/136313/download This test is not yet approved or cleared by the [place] FDA and has been authorized for detection and/or diagnosis of SARS-CoV-2 by FDA under an Emergency Use Authorization (EUA).  This EUA will remain in effect (meaning this test can be used) for the duration of the COVID-19 declaration under Section 564(b)(1) of the Act, 21 U.S.C. section 360bbb-3(b)(1), unless the authorization is terminated or revoked sooner. Performed at [facility],  [address] [place], [place] [address]          Radiology Studies: Ct Head Wo Contrast  Result Date: 06/25/2019 CLINICAL DATA:  Reports her Synthroid was titrated approximately 3 weeks ago and following this she started to notice episodes of slurred speech and thought this might be related to her medication adjustment. EXAM: CT HEAD WITHOUT CONTRAST TECHNIQUE: Contiguous axial images were obtained from the base of the skull through the vertex without intravenous contrast. FINDINGS: Brain: No evidence of acute infarction, hemorrhage, hydrocephalus, extra-axial collection or mass lesion/mass effect. Vascular: No hyperdense vessel or unexpected calcification. Skull: Normal. Negative for fracture or focal lesion. Sinuses/Orbits: No acute finding. Other: None. IMPRESSION: Negative exam. Electronically Signed   By: Norva PavlovElizabeth  Brown M.D.   On: 06/25/2019 14:07   Dg Chest Portable 1 View  Result Date: 06/25/2019 CLINICAL DATA:  Shortness of breath, fever, cough. EXAM: PORTABLE CHEST 1 VIEW COMPARISON:  Radiograph of Mar 11, 2019. FINDINGS: The heart size and mediastinal contours are within normal limits. No pneumothorax is noted. Increased diffuse interstitial densities are noted throughout both lungs consistent with pulmonary edema. Minimal pleural effusions are noted. The visualized skeletal structures are unremarkable. IMPRESSION: Findings most consistent with bilateral pulmonary edema and minimal pleural effusions. Electronically Signed   By: Lupita RaiderJames  Green Jr M.D.   On: 06/25/2019 06:27        Scheduled Meds: . aspirin EC  81 mg Oral Daily  . atorvastatin  20 mg Oral Daily  . bisoprolol  2.5 mg Oral Daily  . famotidine  20 mg Oral Daily  . furosemide  40 mg Intravenous BID  . ipratropium-albuterol  3 mL Nebulization TID  . levothyroxine  88 mcg Oral Daily  . methylPREDNISolone (SOLU-MEDROL) injection  40 mg Intravenous Q12H  . nicotine  21 mg Transdermal Daily  . potassium chloride  20 mEq Oral  BID  . sodium chloride flush  3 mL Intravenous Q12H   Continuous Infusions: . sodium chloride 250 mL (06/26/19 0959)  . heparin 850 Units/hr (06/26/19 1000)     LOS: 1 day    Time spent: 35min    Zannie CovePreetha Salvatore Poe, MD [facility]  06/26/2019, 12:04 PM

## 2019-06-26 NOTE — Progress Notes (Addendum)
ANTICOAGULATION CONSULT NOTE - Follow Up Consult  Pharmacy Consult for Heparin Indication: chest pain/ACS  Allergies  Allergen Reactions  . Brisdelle [Paroxetine Mesylate] Other (See Comments)    seizure  . Bee Venom Rash  . Hydrocodone Rash    Patient Measurements: Height: 5\' 4"  (162.6 cm) Weight: 118 lb 13.3 oz (53.9 kg) IBW/kg (Calculated) : 54.7 Heparin Dosing Weight: 54.4 kg  Vital Signs: Temp: 98.4 F (36.9 C) (08/22 0449) Temp Source: Oral (08/22 0449) BP: 114/86 (08/22 0449) Pulse Rate: 90 (08/22 0449)  Labs: Recent Labs    06/25/19 0803 06/25/19 1031 06/25/19 1901 06/25/19 2002 06/25/19 2234 06/26/19 0225 06/26/19 0608  HGB 12.3  --   --   --   --   --  12.2  HCT 39.4  --   --   --   --   --  36.8  PLT 400  --   --   --   --   --  385  HEPARINUNFRC  --   --  <0.10*  --   --   --  0.34  CREATININE 0.79  --   --   --   --  0.78  --   TROPONINIHS 371* 337*  --  325* 283*  --   --     Estimated Creatinine Clearance: 62.8 mL/min (by C-G formula based on SCr of 0.78 mg/dL).  Assessment: Patient presented to ED with SOB. Elevated troponin. Reviewed medications and not on NOACS. Pharmacy asked to start heparin. Plan is for L/R cath once stable.  Heparin level undetectable 8/21, patient received 1650 bolus and increase heparin from 650 units/hour to 850 units/hour. First therapeutic heparin level 8/22 at 0.34. No signs or symptoms of bleed.  Goal of Therapy:  Heparin level 0.3-0.7 units/ml Monitor platelets by anticoagulation protocol: Yes   Plan:  Continue heparin 850 units/hour 1200 HL to confirm therapeutic levels Daily heparin level and CBC Monitor for signs and symptoms of bleed         Stephanine Reas L. Devin Going, PharmD, [place] PGY1 Pharmacy Resident 06/26/19      7:36 AM  Please check AMION for all [place] phone numbers After 10:00 PM, call the [place] [phone]

## 2019-06-26 NOTE — Progress Notes (Signed)
Progress Note  Patient Name: Annette Morrison Date of Encounter: 06/26/2019  Primary Cardiologist: Nona DellSamuel McDowell, MD  Subjective   Looks much more comfortable today.  Breathing more easily.  Still has intermittent chest discomfort.  No palpitations.  Inpatient Medications    Scheduled Meds: . aspirin EC  81 mg Oral Daily  . atorvastatin  20 mg Oral Daily  . famotidine  20 mg Oral Daily  . furosemide  40 mg Intravenous BID  . ipratropium-albuterol  3 mL Nebulization TID  . levothyroxine  88 mcg Oral Daily  . methylPREDNISolone (SOLU-MEDROL) injection  40 mg Intravenous Q12H  . nicotine  21 mg Transdermal Daily  . potassium chloride  20 mEq Oral BID  . sodium chloride flush  3 mL Intravenous Q12H   Continuous Infusions: . sodium chloride 250 mL (06/26/19 0959)  . heparin 850 Units/hr (06/26/19 1000)   PRN Meds: sodium chloride, acetaminophen, ipratropium-albuterol, ondansetron (ZOFRAN) IV, sodium chloride flush   Vital Signs    Vitals:   06/26/19 0249 06/26/19 0449 06/26/19 0836 06/26/19 0849  BP: 115/81 114/86  115/82  Pulse: 88 90  (!) 108  Resp: (!) 21 17  (!) 24  Temp: 98 F (36.7 C) 98.4 F (36.9 C)    TempSrc: Oral Oral    SpO2: 99% 99% 97% 92%  Weight:  53.9 kg    Height:        Intake/Output Summary (Last 24 hours) at 06/26/2019 1100 Last data filed at 06/26/2019 0625 Gross per 24 hour  Intake 480.64 ml  Output 1250 ml  Net -769.36 ml   Filed Weights   06/25/19 0629 06/26/19 0449  Weight: 54.4 kg 53.9 kg    Telemetry    Sinus rhythm.  Personally reviewed.  ECG    An ECG dated 06/25/2019 was personally reviewed today and demonstrated:  Sinus tachycardia with low voltage and left anterior fascicular block.  Physical Exam   GEN:  Chronically ill-appearing woman, no distress. Neck: No JVD.  Cardiac: RRR, no gallop.  Respiratory: Nonlabored.  No active wheezing. GI: Soft, nontender, bowel sounds present. MS: No edema; No deformity. Neuro:   Nonfocal. Psych: Alert and oriented x 3. Normal affect.  Labs    Chemistry Recent Labs  Lab 06/25/19 0803 06/26/19 0225  NA 143 139  K 3.5 4.4  CL 107 102  CO2 22 23  GLUCOSE 135* 125*  BUN 12 12  CREATININE 0.79 0.78  CALCIUM 9.2 9.5  PROT 7.4  --   ALBUMIN 3.0*  --   AST 17  --   ALT <5  --   ALKPHOS 114  --   BILITOT 0.1*  --   GFRNONAA >60 >60  GFRAA >60 >60  ANIONGAP 14 14     Hematology Recent Labs  Lab 06/25/19 0803 06/26/19 0608  WBC 16.2* 12.1*  RBC 4.22 4.13  HGB 12.3 12.2  HCT 39.4 36.8  MCV 93.4 89.1  MCH 29.1 29.5  MCHC 31.2 33.2  RDW 14.6 14.4  PLT 400 385    Cardiac Enzymes Recent Labs  Lab 06/25/19 0803 06/25/19 1031 06/25/19 2002 06/25/19 2234  TROPONINIHS 371* 337* 325* 283*    BNP Recent Labs  Lab 06/25/19 0803  BNP 649.0*     Radiology    Ct Head Wo Contrast  Result Date: 06/25/2019 CLINICAL DATA:  Reports her Synthroid was titrated approximately 3 weeks ago and following this she started to notice episodes of slurred speech and thought this might  be related to her medication adjustment. EXAM: CT HEAD WITHOUT CONTRAST TECHNIQUE: Contiguous axial images were obtained from the base of the skull through the vertex without intravenous contrast. FINDINGS: Brain: No evidence of acute infarction, hemorrhage, hydrocephalus, extra-axial collection or mass lesion/mass effect. Vascular: No hyperdense vessel or unexpected calcification. Skull: Normal. Negative for fracture or focal lesion. Sinuses/Orbits: No acute finding. Other: None. IMPRESSION: Negative exam. Electronically Signed   By: Nolon Nations M.D.   On: 06/25/2019 14:07   Dg Chest Portable 1 View  Result Date: 06/25/2019 CLINICAL DATA:  Shortness of breath, fever, cough. EXAM: PORTABLE CHEST 1 VIEW COMPARISON:  Radiograph of Mar 11, 2019. FINDINGS: The heart size and mediastinal contours are within normal limits. No pneumothorax is noted. Increased diffuse interstitial  densities are noted throughout both lungs consistent with pulmonary edema. Minimal pleural effusions are noted. The visualized skeletal structures are unremarkable. IMPRESSION: Findings most consistent with bilateral pulmonary edema and minimal pleural effusions. Electronically Signed   By: Marijo Conception M.D.   On: 06/25/2019 06:27    Cardiac Studies   Echocardiogram 06/25/2019:  1. The left ventricle has mild-moderately reduced systolic function, with an ejection fraction of 40-45%. The cavity size was mildly dilated. Left ventricular diastolic Doppler parameters are consistent with impaired relaxation.  2. There is akinesis of the left ventricular, mid septal wall.  3. There iw akinesis of the left ventricular, basal-mid inferolateral wall.  4. The right ventricle has normal systolic function. The cavity was normal. There is no increase in right ventricular wall thickness. Right ventricular systolic pressure is normal with an estimated pressure of 22.7 mmHg.  5. The aortic valve is tricuspid. Mild calcification of the aortic valve. Mild aortic annular calcification noted.  6. The mitral valve is grossly normal. There is mild mitral annular calcification present.  7. The tricuspid valve is grossly normal.  8. The aorta is normal unless otherwise noted.  9. Evidence of atrial level shunting detected by color flow Doppler.  Patient Profile     63 y.o. female with a history of hypertension, hyperlipidemia, hypothyroidism, and longstanding tobacco abuse now presenting with COPD exacerbation and acute combined heart failure and chest pain with newly documented cardiomyopathy.  Assessment & Plan    1.  Acute combined heart failure, LVEF 40 to 45% with wall motion abnormalities suggesting ischemic cardiomyopathy.  She presented with intermittent chest discomfort and shortness of breath recently, high-sensitivity troponin I levels are relatively flat pattern in the 200-300 range (more suggestive of  heart failure/strain).  2.  Longstanding tobacco abuse with suspected COPD exacerbation.  Patient is breathing more comfortably today, presently on nebulizer treatments and steroids per primary team.  3.  Essential hypertension.  Cozaar currently on hold with relatively low blood pressures.  4.  Mixed hyperlipidemia, on statin therapy.  5.  Reported trouble with speech and word finding.  No obvious focal motor weakness however.  Head CT was negative.  Brain MRI is pending.  Continue aspirin, Lipitor, IV Lasix with potassium supplements.  Will start low-dose bisoprolol as well.  Hold off on Cozaar for now but can hopefully add back after anticipated angiography.  It will be important to exclude an acute stroke with brain MRI prior to pursuing cardiac catheterization.  If negative, we can plan on cardiac catheterization on Monday.  Continue heparin.  Check CBC and BMET in a.m.  Signed, Rozann Lesches, MD  06/26/2019, 11:00 AM

## 2019-06-26 NOTE — Progress Notes (Signed)
ANTICOAGULATION CONSULT NOTE - Follow Up Consult  Pharmacy Consult for Heparin Indication: chest pain/ACS  Allergies  Allergen Reactions  . Brisdelle [Paroxetine Mesylate] Other (See Comments)    seizure  . Bee Venom Rash  . Hydrocodone Rash    Patient Measurements: Height: 5\' 4"  (162.6 cm) Weight: 118 lb 13.3 oz (53.9 kg) IBW/kg (Calculated) : 54.7 Heparin Dosing Weight: 54.4 kg  Vital Signs: Temp: 98.4 F (36.9 C) (08/22 0449) Temp Source: Oral (08/22 0449) BP: 115/82 (08/22 0849) Pulse Rate: 108 (08/22 0849)  Labs: Recent Labs    06/25/19 0803 06/25/19 1031 06/25/19 1901 06/25/19 2002 06/25/19 2234 06/26/19 0225 06/26/19 9323 06/26/19 1210  HGB 12.3  --   --   --   --   --  12.2  --   HCT 39.4  --   --   --   --   --  36.8  --   PLT 400  --   --   --   --   --  385  --   HEPARINUNFRC  --   --  <0.10*  --   --   --  0.34 0.16*  CREATININE 0.79  --   --   --   --  0.78  --   --   TROPONINIHS 371* 337*  --  325* 283*  --   --   --     Estimated Creatinine Clearance: 62.8 mL/min (by C-G formula based on SCr of 0.78 mg/dL).  Assessment: Patient presented to ED with SOB. Elevated troponin. Reviewed medications and not on NOACS. Pharmacy asked to start heparin. Plan is for L/R cath once stable.  Heparin level undetectable 8/21, patient received 1650 bolus and increase heparin from 650 units/hour to 850 units/hour. First therapeutic heparin level 8/22 at 0.34. No signs or symptoms of bleed.  Ordered confirmatory level this afternoon, now subtherapeutic.  Spoke to RN, heparin was off for a little while due to IV infiltration, likely the reason for low level.  Goal of Therapy:  Heparin level 0.3-0.7 units/ml Monitor platelets by anticoagulation protocol: Yes   Plan:  Continue heparin 850 units/hour Recheck heparin level at 1800 pm. Daily heparin level and CBC Monitor for signs and symptoms of bleed  Marguerite Olea, [facility] Clinical  Pharmacist Phone [phone]  06/26/2019 3:43 PM           Mimi L. Devin Going, PharmD, [place] PGY1 Pharmacy Resident 06/26/19      3:41 PM  Please check AMION for all [place] phone numbers After 10:00 PM, call the [place] [phone]

## 2019-06-26 NOTE — Progress Notes (Signed)
ANTICOAGULATION CONSULT NOTE - Follow Up Consult  Pharmacy Consult for Heparin Indication: chest pain/ACS  Allergies  Allergen Reactions  . Brisdelle [Paroxetine Mesylate] Other (See Comments)    seizure  . Bee Venom Rash  . Hydrocodone Rash    Patient Measurements: Height: 5\' 4"  (162.6 cm) Weight: 118 lb 13.3 oz (53.9 kg) IBW/kg (Calculated) : 54.7 Heparin Dosing Weight: 54.4 kg  Vital Signs: BP: 115/82 (08/22 0849) Pulse Rate: 108 (08/22 0849)  Labs: Recent Labs    06/25/19 0803 06/25/19 1031  06/25/19 2002 06/25/19 2234 06/26/19 0225 06/26/19 0608 06/26/19 1210 06/26/19 1844  HGB 12.3  --   --   --   --   --  12.2  --   --   HCT 39.4  --   --   --   --   --  36.8  --   --   PLT 400  --   --   --   --   --  385  --   --   HEPARINUNFRC  --   --    < >  --   --   --  0.34 0.16* 0.30  CREATININE 0.79  --   --   --   --  0.78  --   --   --   TROPONINIHS 371* 337*  --  325* 283*  --   --   --   --    < > = values in this interval not displayed.    Estimated Creatinine Clearance: 62.8 mL/min (by C-G formula based on SCr of 0.78 mg/dL).  Assessment: Patient presented to ED with SOB. She continues on IV heparin. Heparin level is therapeutic but at the lower end of the goal range. No bleeding noted.    Goal of Therapy:  Heparin level 0.3-0.7 units/ml Monitor platelets by anticoagulation protocol: Yes   Plan:  Increase heparin to 900 units/hour Daily heparin level and CBC  Salome Arnt, PharmD, BCPS Please see AMION for all pharmacy numbers 06/26/2019 7:20 PM

## 2019-06-27 ENCOUNTER — Inpatient Hospital Stay (HOSPITAL_COMMUNITY): Payer: Medicaid Other

## 2019-06-27 ENCOUNTER — Encounter (HOSPITAL_COMMUNITY): Payer: Self-pay | Admitting: Radiology

## 2019-06-27 DIAGNOSIS — I5021 Acute systolic (congestive) heart failure: Secondary | ICD-10-CM

## 2019-06-27 DIAGNOSIS — I6310 Cerebral infarction due to embolism of unspecified precerebral artery: Secondary | ICD-10-CM

## 2019-06-27 LAB — CBC
HCT: 38.2 % (ref 36.0–46.0)
Hemoglobin: 12.2 g/dL (ref 12.0–15.0)
MCH: 29.2 pg (ref 26.0–34.0)
MCHC: 31.9 g/dL (ref 30.0–36.0)
MCV: 91.4 fL (ref 80.0–100.0)
Platelets: 418 10*3/uL — ABNORMAL HIGH (ref 150–400)
RBC: 4.18 MIL/uL (ref 3.87–5.11)
RDW: 14.7 % (ref 11.5–15.5)
WBC: 14.3 10*3/uL — ABNORMAL HIGH (ref 4.0–10.5)
nRBC: 0 % (ref 0.0–0.2)

## 2019-06-27 LAB — BASIC METABOLIC PANEL
Anion gap: 15 (ref 5–15)
BUN: 24 mg/dL — ABNORMAL HIGH (ref 8–23)
CO2: 24 mmol/L (ref 22–32)
Calcium: 9.8 mg/dL (ref 8.9–10.3)
Chloride: 100 mmol/L (ref 98–111)
Creatinine, Ser: 0.83 mg/dL (ref 0.44–1.00)
GFR calc Af Amer: >60 mL/min (ref >60)
GFR calc non Af Amer: >60 mL/min (ref >60)
Glucose, Bld: 130 mg/dL — ABNORMAL HIGH (ref 70–99)
Potassium: 4.5 mmol/L (ref 3.5–5.1)
Sodium: 139 mmol/L (ref 135–145)

## 2019-06-27 LAB — HEPARIN LEVEL (UNFRACTIONATED): Heparin Unfractionated: 0.35 IU/mL (ref 0.30–0.70)

## 2019-06-27 MED ORDER — SPIRONOLACTONE 12.5 MG HALF TABLET
12.5000 mg | ORAL_TABLET | Freq: Every day | ORAL | Status: DC
  Administered 2019-06-27 – 2019-06-29 (×3): 12.5 mg via ORAL
  Filled 2019-06-27 (×3): qty 1

## 2019-06-27 MED ORDER — POTASSIUM CHLORIDE 20 MEQ PO PACK
20.0000 meq | PACK | Freq: Every day | ORAL | Status: DC
  Administered 2019-06-28 – 2019-06-29 (×2): 20 meq via ORAL
  Filled 2019-06-27 (×3): qty 1

## 2019-06-27 MED ORDER — IOHEXOL 350 MG/ML SOLN
50.0000 mL | Freq: Once | INTRAVENOUS | Status: AC | PRN
  Administered 2019-06-27: 50 mL via INTRAVENOUS

## 2019-06-27 MED ORDER — FUROSEMIDE 40 MG PO TABS
40.0000 mg | ORAL_TABLET | Freq: Every day | ORAL | Status: DC
  Administered 2019-06-28: 10:00:00 40 mg via ORAL
  Filled 2019-06-27: qty 1

## 2019-06-27 MED ORDER — METHYLPREDNISOLONE SODIUM SUCC 40 MG IJ SOLR
40.0000 mg | Freq: Two times a day (BID) | INTRAMUSCULAR | Status: AC
  Administered 2019-06-27: 20:00:00 40 mg via INTRAVENOUS
  Filled 2019-06-27: qty 1

## 2019-06-27 MED ORDER — PREDNISONE 20 MG PO TABS
40.0000 mg | ORAL_TABLET | Freq: Every day | ORAL | Status: DC
  Administered 2019-06-28 – 2019-06-29 (×2): 40 mg via ORAL
  Filled 2019-06-27 (×2): qty 2

## 2019-06-27 NOTE — Progress Notes (Signed)
ANTICOAGULATION CONSULT NOTE - Follow Up Consult  Pharmacy Consult for Heparin Indication: chest pain/ACS  Allergies  Allergen Reactions  . Brisdelle [Paroxetine Mesylate] Other (See Comments)    seizure  . Bee Venom Rash  . Hydrocodone Rash    Patient Measurements: Height: 5\' 4"  (162.6 cm) Weight: 107 lb 5.8 oz (48.7 kg) IBW/kg (Calculated) : 54.7 Heparin Dosing Weight: 54.4 kg  Vital Signs: Temp: 97.6 F (36.4 C) (08/23 0737) Temp Source: Oral (08/23 0737) BP: 105/78 (08/23 0737) Pulse Rate: 79 (08/23 0737)  Labs: Recent Labs    06/25/19 0803 06/25/19 1031  06/25/19 2002 06/25/19 2234 06/26/19 0225 06/26/19 0608 06/26/19 1210 06/26/19 1844 06/27/19 0348  HGB 12.3  --   --   --   --   --  12.2  --   --  12.2  HCT 39.4  --   --   --   --   --  36.8  --   --  38.2  PLT 400  --   --   --   --   --  385  --   --  418*  HEPARINUNFRC  --   --    < >  --   --   --  0.34 0.16* 0.30 0.35  CREATININE 0.79  --   --   --   --  0.78  --   --   --  0.83  TROPONINIHS 371* 337*  --  325* 283*  --   --   --   --   --    < > = values in this interval not displayed.    Estimated Creatinine Clearance: 54.7 mL/min (by C-G formula based on SCr of 0.83 mg/dL).  Assessment: Patient presented to ED with SOB. Elevated troponin. Reviewed medications and not on NOACS. Pharmacy asked to start heparin. Plan is for L/R cath 8/24.  Heparin level undetectable 8/21, patient received 1650 bolus and increased heparin from 650 units/hour to 850 units/hour. First therapeutic heparin level 8/22 at 0.34. Dose increased to 900 units/hour to maintain therapeutic levels, confirmatory heparin level of 0.35 8/23 ~0400. No signs or symptoms of bleed.  Goal of Therapy:  Heparin level 0.3-0.7 units/ml Monitor platelets by anticoagulation protocol: Yes   Plan:  Continue heparin 900 units/hour Daily heparin level and CBC Monitor for signs and symptoms of bleed         Zendaya Groseclose L. Devin Going, PharmD,  [place] PGY1 Pharmacy Resident 06/27/19      7:50 AM  Please check AMION for all [place] phone numbers After 10:00 PM, call the [place] [phone]

## 2019-06-27 NOTE — Progress Notes (Signed)
Progress Note  Patient Name: Annette IdeBrenda Morrison Date of Encounter: 06/27/2019  Primary Cardiologist: Nona DellSamuel , MD  Subjective   Mild feeling of fullness in her chest but generally feels much better.  No breathlessness at rest.  No palpitations.  Still states that she has difficulty finding words.  Inpatient Medications    Scheduled Meds: . aspirin EC  81 mg Oral Daily  . atorvastatin  20 mg Oral Daily  . bisoprolol  2.5 mg Oral Daily  . famotidine  20 mg Oral Daily  . furosemide  40 mg Intravenous BID  . levothyroxine  88 mcg Oral Daily  . methylPREDNISolone (SOLU-MEDROL) injection  40 mg Intravenous Q12H  . nicotine  21 mg Transdermal Daily  . potassium chloride  20 mEq Oral BID  . sodium chloride flush  3 mL Intravenous Q12H   Continuous Infusions: . sodium chloride 250 mL (06/26/19 0959)  . heparin 900 Units/hr (06/27/19 0600)   PRN Meds: sodium chloride, acetaminophen, HYDROcodone-acetaminophen, ipratropium-albuterol, ondansetron (ZOFRAN) IV, sodium chloride flush   Vital Signs    Vitals:   06/26/19 2120 06/27/19 0347 06/27/19 0504 06/27/19 0737  BP:  98/79 104/79 105/78  Pulse: 87   79  Resp:  18 14 16   Temp:  97.6 F (36.4 C) 97.7 F (36.5 C) 97.6 F (36.4 C)  TempSrc:  Oral Oral Oral  SpO2:  97% 98% 98%  Weight:  48.7 kg    Height:        Intake/Output Summary (Last 24 hours) at 06/27/2019 0928 Last data filed at 06/27/2019 0600 Gross per 24 hour  Intake 862.64 ml  Output 2450 ml  Net -1587.36 ml   Filed Weights   06/25/19 0629 06/26/19 0449 06/27/19 0347  Weight: 54.4 kg 53.9 kg 48.7 kg    Telemetry    Sinus rhythm.  Personally reviewed.  ECG    An ECG dated 06/25/2019 was personally reviewed today and demonstrated:  Sinus tachycardia with low voltage and left anterior fascicular block.  Physical Exam   GEN: No acute distress.   Neck: No JVD. Cardiac: RRR, no murmur, rub, or gallop.  Respiratory: Nonlabored.  No active wheezing.  GI: Soft, nontender, bowel sounds present. MS: No edema; No deformity. Neuro:  Nonfocal.  No obvious dysarthria. Psych: Alert and oriented x 3. Normal affect.  Labs    Chemistry Recent Labs  Lab 06/25/19 0803 06/26/19 0225 06/27/19 0348  NA 143 139 139  K 3.5 4.4 4.5  CL 107 102 100  CO2 22 23 24   GLUCOSE 135* 125* 130*  BUN 12 12 24*  CREATININE 0.79 0.78 0.83  CALCIUM 9.2 9.5 9.8  PROT 7.4  --   --   ALBUMIN 3.0*  --   --   AST 17  --   --   ALT <5  --   --   ALKPHOS 114  --   --   BILITOT 0.1*  --   --   GFRNONAA >60 >60 >60  GFRAA >60 >60 >60  ANIONGAP 14 14 15      Hematology Recent Labs  Lab 06/25/19 0803 06/26/19 0608 06/27/19 0348  WBC 16.2* 12.1* 14.3*  RBC 4.22 4.13 4.18  HGB 12.3 12.2 12.2  HCT 39.4 36.8 38.2  MCV 93.4 89.1 91.4  MCH 29.1 29.5 29.2  MCHC 31.2 33.2 31.9  RDW 14.6 14.4 14.7  PLT 400 385 418*    Cardiac Enzymes Recent Labs  Lab 06/25/19 0803 06/25/19 1031 06/25/19 2002 06/25/19  2234  TROPONINIHS 371* 337* 325* 283*    BNP Recent Labs  Lab 06/25/19 0803  BNP 649.0*     Radiology    Ct Head Wo Contrast  Result Date: 06/25/2019 CLINICAL DATA:  Reports her Synthroid was titrated approximately 3 weeks ago and following this she started to notice episodes of slurred speech and thought this might be related to her medication adjustment. EXAM: CT HEAD WITHOUT CONTRAST TECHNIQUE: Contiguous axial images were obtained from the base of the skull through the vertex without intravenous contrast. FINDINGS: Brain: No evidence of acute infarction, hemorrhage, hydrocephalus, extra-axial collection or mass lesion/mass effect. Vascular: No hyperdense vessel or unexpected calcification. Skull: Normal. Negative for fracture or focal lesion. Sinuses/Orbits: No acute finding. Other: None. IMPRESSION: Negative exam. Electronically Signed   By: Nolon Nations M.D.   On: 06/25/2019 14:07   Mr Brain Wo Contrast  Result Date: 06/26/2019  CLINICAL DATA:  Episodes of slurred speech and word-finding difficulty. Focal neuro deficits of greater than 6 hours. EXAM: MRI HEAD WITHOUT CONTRAST TECHNIQUE: Multiplanar, multiecho pulse sequences of the brain and surrounding structures were obtained without intravenous contrast. COMPARISON:  CT head without contrast 06/25/2019 FINDINGS: Brain: Diffusion-weighted images demonstrate 3 separate punctate cortical areas of restricted diffusion within a posterior border zone distribution. Other acute infarcts are present. No acute hemorrhage or mass lesion is present. Periventricular and subcortical T2 hyperintensities are mildly advanced for age. Prominent subcortical focus is present in the anterior left frontal lobe. There is a remote lacunar infarct involving the right thalamus. The internal auditory canals are within normal limits. The brainstem and cerebellum are within normal limits. Vascular: Flow is present in the major intracranial arteries. Skull and upper cervical spine: The craniocervical junction is normal. Upper cervical spine is within normal limits. Marrow signal is unremarkable. Sinuses/Orbits: The paranasal sinuses and mastoid air cells are clear. The globes and orbits are within normal limits. IMPRESSION: 1. At least 3 punctate foci of acute nonhemorrhagic infarct in a posterior border zone distribution. Question posterior circulation the disease in the neck. 2. Remote lacunar infarct of the right thalamus. 3. Scattered white matter disease is mildly advanced for age. The finding is nonspecific but can be seen in the setting of chronic microvascular ischemia, a demyelinating process such as multiple sclerosis, vasculitis, complicated migraine headaches, or as the sequelae of a prior infectious or inflammatory process. Electronically Signed   By: San Morelle M.D.   On: 06/26/2019 15:27    Cardiac Studies   Echocardiogram 06/25/2019: 1. The left ventricle has mild-moderately reduced  systolic function, with an ejection fraction of 40-45%. The cavity size was mildly dilated. Left ventricular diastolic Doppler parameters are consistent with impaired relaxation. 2. There is akinesis of the left ventricular, mid septal wall. 3. There iw akinesis of the left ventricular, basal-mid inferolateral wall. 4. The right ventricle has normal systolic function. The cavity was normal. There is no increase in right ventricular wall thickness. Right ventricular systolic pressure is normal with an estimated pressure of 22.7 mmHg. 5. The aortic valve is tricuspid. Mild calcification of the aortic valve. Mild aortic annular calcification noted. 6. The mitral valve is grossly normal. There is mild mitral annular calcification present. 7. The tricuspid valve is grossly normal. 8. The aorta is normal unless otherwise noted. 9. Evidence of atrial level shunting detected by color flow Doppler.  Patient Profile     63 y.o. female with a history of hypertension, hyperlipidemia, hypothyroidism, and longstanding tobacco abuse now presenting  with COPD exacerbation and acute combined heart failure and chest pain with newly documented cardiomyopathy.  Assessment & Plan    1.  Acute combined heart failure with LVEF 40 to 45% and wall motion abnormalities suggesting ischemic cardiomyopathy.  Clinically, she is doing better at this time on medical therapy.  High-sensitivity troponin I levels are in a relatively flat pattern, 200-300 range, more suggestive of heart failure/strain than ACS.  2.  Difficulty with speech and word finding.  Brain MRI from yesterday demonstrated at least 3 punctate acute nonhemorrhagic infarcts in the posterior border zone distribution, possible posterior circulation.  Also evidence of remote lacunar infarct in the right thalamus and scattered white matter disease.  3.  Longstanding tobacco abuse history with suspected COPD exacerbation.  Patient appears more comfortable at  this point and is on steroids and nebulizer treatments.  4.  Essential hypertension blood pressure stable in the low normal range.  Cozaar is held at present.  5.  Mixed hyperlipidemia on statin therapy.  Situation discussed with the patient.  At this point I would hold off on cardiac catheterization given recent acute although relatively small strokes.  She has responded well to medical therapy from a cardiac perspective.  Continue aspirin, Lipitor, and Zebeta.  We will start low-dose Aldactone and change to oral Lasix with potassium supplements.  Signed, Nona DellSamuel , MD  06/27/2019, 9:28 AM

## 2019-06-27 NOTE — Consult Note (Addendum)
NEURO HOSPITALIST  CONSULT   Requesting Physician: Dr. Jomarie LongsJoseph    Chief Complaint: stroke seen on MRI  History obtained from:  Patient   HPI:                                                                                                                                         Meredeth IdeBrenda Esses is an 63 y.o. female  With PMH HTN, HLD, tobaccos use, hypothyroidism who presented with c/o worsening SOB.   Per patient she has been having intermittent SOB for 2-3 weeks. Worse with exertion. Last night she developed significant SOB with CP and also c/o intermittent slurred speech and difficulty finding words for the past several weeks. Denies any focal weakness, falls, blurred vision, difficulty swallowing, numbness or tingling. No prior stroke history. Usually takes daily ASA 81 mg, but had stopped d/t running out of ASA. Endorses smoking 1 pack per day. Still c/o of SOB and chest heaviness, but slightly better than on admission.   hospital course:  BP: 100/75 BG: 130 MRI:3 punctate foci infarct in border zone; remote lacunar infarct right thalamus; scattered white matter disease CTA: pending  CTH: no hemorrhage    Date last known well: 2-3 weeks ago  Time last known well: unable to determine. tPA Given: no outside of window Modified Rankin: Rankin Score=1 NIHSS:2; questions and facial droop    Past Medical History:  Diagnosis Date  . Hyperlipidemia   . Hypertension   . Thyroid disease     Past Surgical History:  Procedure Laterality Date  . ABDOMINAL HYSTERECTOMY      Family History  Problem Relation Age of Onset  . Diabetes Mother   . Heart disease Father        Died of MI at age 61  . CAD Sister          Social History:  reports that she has been smoking. She has a 48.00 pack-year smoking history. She has never used smokeless tobacco. She reports previous alcohol use. She reports that she does not use drugs.  Allergies:   Allergies  Allergen Reactions  . Brisdelle [Paroxetine Mesylate] Other (See Comments)    seizure  . Bee Venom Rash  . Hydrocodone Rash    Medications:  Scheduled: . aspirin EC  81 mg Oral Daily  . atorvastatin  20 mg Oral Daily  . bisoprolol  2.5 mg Oral Daily  . famotidine  20 mg Oral Daily  . furosemide  40 mg Oral Daily  . levothyroxine  88 mcg Oral Daily  . methylPREDNISolone (SOLU-MEDROL) injection  40 mg Intravenous Q12H  . nicotine  21 mg Transdermal Daily  . potassium chloride  20 mEq Oral Daily  . sodium chloride flush  3 mL Intravenous Q12H  . spironolactone  12.5 mg Oral Daily   Continuous: . sodium chloride 250 mL (06/26/19 0959)  . heparin 900 Units/hr (06/27/19 0600)   WUJ:WJXBJYPRN:sodium chloride, acetaminophen, HYDROcodone-acetaminophen, ipratropium-albuterol, ondansetron (ZOFRAN) IV, sodium chloride flush   ROS:                                                                                                                                       ROS was performed and is negative except as noted in HPI    General Examination:                                                                                                      Blood pressure 100/75, pulse 76, temperature 97.8 F (36.6 C), temperature source Oral, resp. rate (!) 21, height 5\' 4"  (1.626 m), weight 48.7 kg, SpO2 99 %.  HEENT-  Normocephalic, no lesions, without obvious abnormality.  Normal external eye and conjunctiva. Cardiovascular- S1-S2 audible, pulses palpable throughout  Lungs- Saturations within normal limits on 2L [place] Extremities- Warm, dry and intact Musculoskeletal-no joint tenderness, deformity or swelling Skin-warm and dry, no hyperpigmentation, vitiligo, or suspicious lesions  Neurological Examination Mental Status: Alert, oriented name/age/year/place. Did not know  month  thought content appropriate.  Speech fluent without evidence of aphasia.  Able to follow step commands without difficulty. Cranial Nerves: II:  Visual fields grossly normal,  III,IV, VI: ptosis not present, extra-ocular motions intact bilaterally, pupils equal, round, reactive to light and accommodation V,VII: smile asymmetric slight left facial droop, facial light touch sensation normal bilaterally VIII: hearing normal bilaterally IX,X: uvula rises midline XI: bilateral shoulder shrug XII: midline tongue extension Motor: Right : Upper extremity   5/5  Left:     Upper extremity   5/5  Lower extremity   5/5   Lower extremity   5/5 Tone and bulk:normal tone throughout; no atrophy noted Sensory: light touch intact throughout, bilaterally Deep Tendon Reflexes: 2+ and symmetric biceps and patella Plantars: Right:  downgoing   Left: downgoing Cerebellar: normal finger-to-nose,  normal heel-to-shin test Gait: deferred   Lab Results: Basic Metabolic Panel: Recent Labs  Lab 06/25/19 0803 06/26/19 0225 06/27/19 0348  NA 143 139 139  K 3.5 4.4 4.5  CL 107 102 100  CO2 22 23 24   GLUCOSE 135* 125* 130*  BUN 12 12 24*  CREATININE 0.79 0.78 0.83  CALCIUM 9.2 9.5 9.8    CBC: Recent Labs  Lab 06/25/19 0803 06/26/19 0608 06/27/19 0348  WBC 16.2* 12.1* 14.3*  NEUTROABS 13.7*  --   --   HGB 12.3 12.2 12.2  HCT 39.4 36.8 38.2  MCV 93.4 89.1 91.4  PLT 400 385 418*    Imaging: Ct Head Wo Contrast  Result Date: 06/25/2019 CLINICAL DATA:  Reports her Synthroid was titrated approximately 3 weeks ago and following this she started to notice episodes of slurred speech and thought this might be related to her medication adjustment. EXAM: CT HEAD WITHOUT CONTRAST TECHNIQUE: Contiguous axial images were obtained from the base of the skull through the vertex without intravenous contrast. FINDINGS: Brain: No evidence of acute infarction, hemorrhage, hydrocephalus, extra-axial collection  or mass lesion/mass effect. Vascular: No hyperdense vessel or unexpected calcification. Skull: Normal. Negative for fracture or focal lesion. Sinuses/Orbits: No acute finding. Other: None. IMPRESSION: Negative exam. Electronically Signed   By: Nolon Nations M.D.   On: 06/25/2019 14:07   Mr Brain Wo Contrast  Result Date: 06/26/2019 CLINICAL DATA:  Episodes of slurred speech and word-finding difficulty. Focal neuro deficits of greater than 6 hours. EXAM: MRI HEAD WITHOUT CONTRAST TECHNIQUE: Multiplanar, multiecho pulse sequences of the brain and surrounding structures were obtained without intravenous contrast. COMPARISON:  CT head without contrast 06/25/2019 FINDINGS: Brain: Diffusion-weighted images demonstrate 3 separate punctate cortical areas of restricted diffusion within a posterior border zone distribution. Other acute infarcts are present. No acute hemorrhage or mass lesion is present. Periventricular and subcortical T2 hyperintensities are mildly advanced for age. Prominent subcortical focus is present in the anterior left frontal lobe. There is a remote lacunar infarct involving the right thalamus. The internal auditory canals are within normal limits. The brainstem and cerebellum are within normal limits. Vascular: Flow is present in the major intracranial arteries. Skull and upper cervical spine: The craniocervical junction is normal. Upper cervical spine is within normal limits. Marrow signal is unremarkable. Sinuses/Orbits: The paranasal sinuses and mastoid air cells are clear. The globes and orbits are within normal limits. IMPRESSION: 1. At least 3 punctate foci of acute nonhemorrhagic infarct in a posterior border zone distribution. Question posterior circulation the disease in the neck. 2. Remote lacunar infarct of the right thalamus. 3. Scattered white matter disease is mildly advanced for age. The finding is nonspecific but can be seen in the setting of chronic microvascular ischemia, a  demyelinating process such as multiple sclerosis, vasculitis, complicated migraine headaches, or as the sequelae of a prior infectious or inflammatory process. Electronically Signed   By: San Morelle M.D.   On: 06/26/2019 15:27       Laurey Morale, MSN, NP-C [facility] [phone]  06/27/2019, 11:21 AM   Attending physician note to follow with Assessment and plan .   Assessment: 63 y.o. female With PMH HTN, HLD, tobaccos use, hypothyroidism who presented with c/o worsening SOB. Also c/o intermittent slurred speech and word finding difficulty for several weeks. MRI: 3 punctate infarct posterior border zone. Right thalamus lacunar infarct. Complete stroke work-up needed.  Stroke Risk Factors - hyperlipidemia,  hypertension and smoking    Recommendations: -- BP goal : Permissive HTN upto 180/110 mmHg --CTA head and neck --Echocardiogram --smoking cessation -- ASA -- High intensity Statin -- HgbA1c, fasting lipid panel -- PT consult, OT consult, Speech consult --Telemetry monitoring --Frequent neuro checks --Stroke swallow screen   --please page stroke NP  Or  PA  Or MD from 8am -4 pm  as this patient from this time will be  followed by the stroke.   You can look them up on [email or web address]  Password [email or web address]  NEUROHOSPITALIST ADDENDUM Performed a face to face diagnostic evaluation.   I have reviewed the contents of history and physical exam as documented by PA/ARNP/Resident and agree with above documentation.  I have discussed and formulated the above plan as documented. Edits to the note have been made as needed.  Neurology consulted for punctate right hemispheric infarcts noted on MRI brain.  This should not cause patient's difficulty in finding words, however wonder if she has had her older punctate infarcts/TIAs.  Regardless this should not prevent her from getting a heart cath. Embolic nature of her strokes do raise suspicion for cardioembolic source,  although atherosclerotic disease is also likely.  Stroke work-up has been initiated and stroke team will follow.    Georgiana SpinnerSushanth Osby Sweetin MD [facility] [phone]   If 7pm to 7am, please call on call as listed on AMION.

## 2019-06-27 NOTE — Progress Notes (Signed)
PROGRESS NOTE    Annette IdeBrenda Morrison  NWG:[id number] DOB: [birth year] DOA: 06/25/2019 PCP: Patient, No Pcp Per  Brief Narrative: 63 year old female history of hypertension dyslipidemia tobacco abuse COPD, hypothyroidism presented to the emergency room with progressive dyspnea on exertion for 2 to 3 weeks worsened the night prior to admission, chest x-ray indicated CHF, she was also noted to be wheezing, on work-up noted to have mildly elevated troponin with nonspecific EKG, seen by cardiology 2D echo in the emergency room showed depressed EF of 40% with new wall motion abnormalities, started on diuretics, IV heparin and [facility] -Also reported ongoing word finding difficulties for 2 to 3 weeks prior to admission  Assessment & Plan:   Acute respiratory failure with hypoxia -Primarily from CHF and COPD exacerbation -As below  Non-STEMI/acute systolic CHF -EF 40 to 45% with multiple new wall motion abnormalities -Continue aspirin, IV Lasix today, started on low-dose bisoprolol -Clinically improving with diuresis, cardiology following -May have multivessel CAD -Discussed with neurology to comment on cardiac catheterization in the setting of small infarcts  Mild COPD exacerbation -Was started on steroids and nebulizations on admission 8/21 -No wheezing noted at this time, cutdown Solu-Medrol transition to prednisone taper from tomorrow  Subacute CVA  -Patient reported slurred speech with word finding difficulty started 2 to 3 weeks ago -CT was on admission was unrevealing, speech considerably better -MRI brain noted 3 punctate posterior border zone infarcts and right thalamus lacunar infarct -Neurology consulted -2D echocardiogram as noted above with EF of 40 to 45% with new wall motion abnormalities -Continue aspirin, high intensity statin -Plan for CTA head and neck -PT OT, SLP evaluations  Dyslipidemia -Continue statin  Hypothyroidism -Continue  Synthroid  DVT prophylaxis: Heparin infusion Code Status: Full code Family Communication:  No family at bedside Disposition Plan:  Pending above work-up  Consultants:   Cardiology  Neurology   Procedures:   Antimicrobials:    Subjective: -Breathing better, speech deficits are improving as well -Continues to have chest pressure with minimal activity  Objective: Vitals:   06/27/19 0347 06/27/19 0504 06/27/19 0737 06/27/19 1101  BP: 98/79 104/79 105/78 100/75  Pulse:   79 76  Resp: 18 14 16  (!) 21  Temp: 97.6 F (36.4 C) 97.7 F (36.5 C) 97.6 F (36.4 C) 97.8 F (36.6 C)  TempSrc: Oral Oral Oral Oral  SpO2: 97% 98% 98% 99%  Weight: 48.7 kg     Height:        Intake/Output Summary (Last 24 hours) at 06/27/2019 1503 Last data filed at 06/27/2019 1200 Gross per 24 hour  Intake 984.1 ml  Output 2050 ml  Net -1065.9 ml   Filed Weights   06/25/19 0629 06/26/19 0449 06/27/19 0347  Weight: 54.4 kg 53.9 kg 48.7 kg    Examination:  Gen: Awake, Alert, Oriented X 3, no distress HEENT: PERRLA, Neck supple, no JVD Lungs: Few basilar crackles, poor air movement, no expiratory wheezes today CVS: S1-S2/regular rate rhythm Abd: soft, Non tender, non distended, BS present Extremities: No edema  skin: no new rashes psychiatry: Judgement and insight appear normal. Mood & affect appropriate.     Data Reviewed:   CBC: Recent Labs  Lab 06/25/19 0803 06/26/19 0608 06/27/19 0348  WBC 16.2* 12.1* 14.3*  NEUTROABS 13.7*  --   --   HGB 12.3 12.2 12.2  HCT 39.4 36.8 38.2  MCV 93.4 89.1 91.4  PLT 400 385 418*   Basic Metabolic Panel: Recent Labs  Lab 06/25/19  [id number] 06/26/19 0225 06/27/19 0348  NA 143 139 139  K 3.5 4.4 4.5  CL 107 102 100  CO2 22 23 24   GLUCOSE 135* 125* 130*  BUN 12 12 24*  CREATININE 0.79 0.78 0.83  CALCIUM 9.2 9.5 9.8   GFR: Estimated Creatinine Clearance: 54.7 mL/min (by C-G formula based on SCr of 0.83 mg/dL). Liver Function  Tests: Recent Labs  Lab 06/25/19 0803  AST 17  ALT <5  ALKPHOS 114  BILITOT 0.1*  PROT 7.4  ALBUMIN 3.0*   No results for input(s): LIPASE, AMYLASE in the last 168 hours. No results for input(s): AMMONIA in the last 168 hours. Coagulation Profile: No results for input(s): INR, PROTIME in the last 168 hours. Cardiac Enzymes: No results for input(s): CKTOTAL, CKMB, CKMBINDEX, TROPONINI in the last 168 hours. BNP (last 3 results) No results for input(s): PROBNP in the last 8760 hours. HbA1C: No results for input(s): HGBA1C in the last 72 hours. CBG: No results for input(s): GLUCAP in the last 168 hours. Lipid Profile: No results for input(s): CHOL, HDL, LDLCALC, TRIG, CHOLHDL, LDLDIRECT in the last 72 hours. Thyroid Function Tests: Recent Labs    06/25/19 1037  TSH 7.231*   Anemia Panel: No results for input(s): VITAMINB12, FOLATE, FERRITIN, TIBC, IRON, RETICCTPCT in the last 72 hours. Urine analysis:    Component Value Date/Time   COLORURINE YELLOW 03/11/2019 2149   APPEARANCEUR HAZY (A) 03/11/2019 2149   LABSPEC 1.015 03/11/2019 2149   PHURINE 7.0 03/11/2019 2149   GLUCOSEU NEGATIVE 03/11/2019 2149   HGBUR MODERATE (A) 03/11/2019 2149   BILIRUBINUR NEGATIVE 03/11/2019 2149   KETONESUR NEGATIVE 03/11/2019 2149   PROTEINUR 30 (A) 03/11/2019 2149   NITRITE NEGATIVE 03/11/2019 2149   LEUKOCYTESUR NEGATIVE 03/11/2019 2149   Sepsis Labs: @LABRCNTIP (procalcitonin:4,lacticidven:4)  ) Recent Results (from the past 240 hour(s))  SARS Coronavirus 2 [facility](Hospital order, Performed [facility]) Nasopharyngeal Nasopharyngeal Swab     Status: None   Collection Time: 06/25/19  6:09 AM   Specimen: Nasopharyngeal Swab  Result Value Ref Range Status   SARS Coronavirus 2 NEGATIVE NEGATIVE Final    Comment: (NOTE) If result is NEGATIVE SARS-CoV-2 target nucleic acids are NOT DETECTED. The SARS-CoV-2 RNA is generally detectable in upper and lower  respiratory  specimens during the acute phase of infection. The lowest  concentration of SARS-CoV-2 viral copies this assay can detect is 250  copies / mL. A negative result does not preclude SARS-CoV-2 infection  and should not be used as the sole basis for treatment or other  patient management decisions.  A negative result may occur with  improper specimen collection / handling, submission of specimen other  than nasopharyngeal swab, presence of viral mutation(s) within the  areas targeted by this assay, and inadequate number of viral copies  (<250 copies / mL). A negative result must be combined with clinical  observations, patient history, and epidemiological information. If result is POSITIVE SARS-CoV-2 target nucleic acids are DETECTED. The SARS-CoV-2 RNA is generally detectable in upper and lower  respiratory specimens dur ing the acute phase of infection.  Positive  results are indicative of active infection with SARS-CoV-2.  Clinical  correlation with patient history and other diagnostic information is  necessary to determine patient infection status.  Positive results do  not rule out bacterial infection or co-infection with other viruses. If result is PRESUMPTIVE POSTIVE SARS-CoV-2 nucleic acids MAY BE PRESENT.   A presumptive positive result was obtained on the  submitted specimen  and confirmed on repeat testing.  While 2019 novel coronavirus  (SARS-CoV-2) nucleic acids may be present in the submitted sample  additional confirmatory testing may be necessary for epidemiological  and / or clinical management purposes  to differentiate between  SARS-CoV-2 and other Sarbecovirus currently known to infect humans.  If clinically indicated additional testing with an alternate test  methodology [phone]) is advised. The SARS-CoV-2 RNA is generally  detectable in upper and lower respiratory sp ecimens during the acute  phase of infection. The expected result is Negative. Fact Sheet for  Patients:  BoilerBrush.com.cy Fact Sheet for Healthcare Providers: https://pope.com/ This test is not yet approved or cleared by the [place] FDA and has been authorized for detection and/or diagnosis of SARS-CoV-2 by FDA under an Emergency Use Authorization (EUA).  This EUA will remain in effect (meaning this test can be used) for the duration of the COVID-19 declaration under Section 564(b)(1) of the Act, 21 U.S.C. section 360bbb-3(b)(1), unless the authorization is terminated or revoked sooner. Performed at [facility], [address], [place], [place] [address]          Radiology Studies: Ct Angio Head W Or Wo Contrast  Result Date: 06/27/2019 CLINICAL DATA:  Stroke, follow-up. Right border zone punctate infarcts. Remote lacunar infarct of the right thalamus. EXAM: CT ANGIOGRAPHY HEAD AND NECK TECHNIQUE: Multidetector CT imaging of the head and neck was performed using the standard protocol during bolus administration of intravenous contrast. Multiplanar CT image reconstructions and MIPs were obtained to evaluate the vascular anatomy. Carotid stenosis measurements (when applicable) are obtained utilizing NASCET criteria, using the distal internal carotid diameter as the denominator. CONTRAST:  50mL OMNIPAQUE IOHEXOL 350 MG/ML SOLN COMPARISON:  MR head without contrast 06/26/2019. CT head without contrast 06/25/2019. FINDINGS: CT HEAD FINDINGS Brain: The punctate acute infarcts on MRI are below the resolution of CT. No acute cortical infarcts are evident. Basal ganglia are intact. Scattered white matter changes are again noted. The ventricles are of normal size. No significant extraaxial fluid collection is present. The brainstem and cerebellum are within normal limits. Vascular: Atherosclerotic calcifications are present within the cavernous internal carotid arteries. There is no hyperdense vessel. Skull: Calvarium is intact. No focal lytic  or blastic lesions are present. Sinuses: The paranasal sinuses and mastoid air cells are clear. Orbits: The globes and orbits are within normal limits. Review of the MIP images confirms the above findings CTA NECK FINDINGS Aortic arch: A 4 vessel arch configuration is present. There dense calcifications with a high-grade stenosis at the origin of the left vertebral artery. Minimal calcifications are present at the left subclavian artery without a significant stenosis. Minimal calcifications are present at the innominate artery without significant stenosis. Right carotid system: Right common carotid artery is within normal limits. Minimal irregularity is present at the bifurcation and proximal right ICA without a significant stenosis. The cervical right ICA is otherwise normal. Left carotid system: The left common carotid artery is within normal limits. Atherosclerotic changes are present at the bifurcation without a significant stenosis. There is a posteromedial plaque in the proximal left ICA without a significant stenosis. There is slight narrowing of the distal left cervical ICA just below the skull base without a significant stenosis. Vertebral arteries: The right vertebral artery is the dominant vessel. It originates from the subclavian artery. There are no tandem stenoses in the left vertebral artery. There is no focal stenosis of the right vertebral artery in the neck. Skeleton: Asymmetric left-sided  facet disease is present in the cervical spine, most evident at C3-4 and C4-5 with foraminal narrowing at these levels. Vertebral body heights and alignment are maintained. No focal lytic or blastic lesions are present. Other neck: The soft tissues of the neck are otherwise unremarkable. There is mild heterogeneity of the thyroid without a dominant lesion. Salivary glands are normal. No significant adenopathy is present. Upper chest: Centrilobular emphysematous changes are present. No focal nodule or mass lesion  is evident. There is scarring at the right apex. Review of the MIP images confirms the above findings CTA HEAD FINDINGS Anterior circulation: Atherosclerotic changes are present in the cavernous internal carotid arteries bilaterally. There is no significant stenosis relative to the more distal vessels. The ICA termini are within normal limits bilaterally. The A1 and M1 segments are normal. MCA bifurcations are intact. ACA and MCA branch vessels are normal. Posterior circulation: Right vertebral artery is the dominant vessel. There is mild narrowing at the dural margin of the right vertebral artery without a significant stenosis relative to the more distal vessel. Vertebrobasilar junction is normal. There is mild narrowing of proximal basilar artery as well. The right posterior cerebral artery originates from the basilar tip. The left posterior cerebral artery is of fetal type. There is some irregularity of distal PCA branch vessels without a significant proximal stenosis or occlusion. Venous sinuses: The dural sinuses are patent. Straight sinus deep cerebral veins are intact. Cortical veins are unremarkable. Anatomic variants: Fetal type left posterior cerebral artery. Review of the MIP images confirms the above findings IMPRESSION: 1. 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 There is dense calcification and high-grade stenosis at the origin of the left vertebral artery. 2. It is unclear this impacts the areas of infarct given that the left posterior cerebral artery is of fetal type. 3. Atherosclerotic changes at the proximal left internal carotid artery without significant stenosis could be a source of emboli. 4. Minimal atherosclerotic changes at the aortic arch in within the cavernous internal carotid arteries otherwise. 5. Asymmetric left-sided facet disease most notable at C3-4 and C4-5. 6.  Emphysema (ICD10-J43.9). Electronically Signed   By: Marin Robertshristopher  Mattern M.D.   On: 06/27/2019 14:00   Ct  Angio Neck W Or Wo Contrast  Result Date: 06/27/2019 CLINICAL DATA:  Stroke, follow-up. Right border zone punctate infarcts. Remote lacunar infarct of the right thalamus. EXAM: CT ANGIOGRAPHY HEAD AND NECK TECHNIQUE: Multidetector CT imaging of the head and neck was performed using the standard protocol during bolus administration of intravenous contrast. Multiplanar CT image reconstructions and MIPs were obtained to evaluate the vascular anatomy. Carotid stenosis measurements (when applicable) are obtained utilizing NASCET criteria, using the distal internal carotid diameter as the denominator. CONTRAST:  50mL OMNIPAQUE IOHEXOL 350 MG/ML SOLN COMPARISON:  MR head without contrast 06/26/2019. CT head without contrast 06/25/2019. FINDINGS: CT HEAD FINDINGS Brain: The punctate acute infarcts on MRI are below the resolution of CT. No acute cortical infarcts are evident. Basal ganglia are intact. Scattered white matter changes are again noted. The ventricles are of normal size. No significant extraaxial fluid collection is present. The brainstem and cerebellum are within normal limits. Vascular: Atherosclerotic calcifications are present within the cavernous internal carotid arteries. There is no hyperdense vessel. Skull: Calvarium is intact. No focal lytic or blastic lesions are present. Sinuses: The paranasal sinuses and mastoid air cells are clear. Orbits: The globes and orbits are within normal limits. Review of the MIP images confirms the above findings CTA NECK  FINDINGS Aortic arch: A 4 vessel arch configuration is present. There dense calcifications with a high-grade stenosis at the origin of the left vertebral artery. Minimal calcifications are present at the left subclavian artery without a significant stenosis. Minimal calcifications are present at the innominate artery without significant stenosis. Right carotid system: Right common carotid artery is within normal limits. Minimal irregularity is present at  the bifurcation and proximal right ICA without a significant stenosis. The cervical right ICA is otherwise normal. Left carotid system: The left common carotid artery is within normal limits. Atherosclerotic changes are present at the bifurcation without a significant stenosis. There is a posteromedial plaque in the proximal left ICA without a significant stenosis. There is slight narrowing of the distal left cervical ICA just below the skull base without a significant stenosis. Vertebral arteries: The right vertebral artery is the dominant vessel. It originates from the subclavian artery. There are no tandem stenoses in the left vertebral artery. There is no focal stenosis of the right vertebral artery in the neck. Skeleton: Asymmetric left-sided facet disease is present in the cervical spine, most evident at C3-4 and C4-5 with foraminal narrowing at these levels. Vertebral body heights and alignment are maintained. No focal lytic or blastic lesions are present. Other neck: The soft tissues of the neck are otherwise unremarkable. There is mild heterogeneity of the thyroid without a dominant lesion. Salivary glands are normal. No significant adenopathy is present. Upper chest: Centrilobular emphysematous changes are present. No focal nodule or mass lesion is evident. There is scarring at the right apex. Review of the MIP images confirms the above findings CTA HEAD FINDINGS Anterior circulation: Atherosclerotic changes are present in the cavernous internal carotid arteries bilaterally. There is no significant stenosis relative to the more distal vessels. The ICA termini are within normal limits bilaterally. The A1 and M1 segments are normal. MCA bifurcations are intact. ACA and MCA branch vessels are normal. Posterior circulation: Right vertebral artery is the dominant vessel. There is mild narrowing at the dural margin of the right vertebral artery without a significant stenosis relative to the more distal vessel.  Vertebrobasilar junction is normal. There is mild narrowing of proximal basilar artery as well. The right posterior cerebral artery originates from the basilar tip. The left posterior cerebral artery is of fetal type. There is some irregularity of distal PCA branch vessels without a significant proximal stenosis or occlusion. Venous sinuses: The dural sinuses are patent. Straight sinus deep cerebral veins are intact. Cortical veins are unremarkable. Anatomic variants: Fetal type left posterior cerebral artery. Review of the MIP images confirms the above findings IMPRESSION: 1. 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 There is dense calcification and high-grade stenosis at the origin of the left vertebral artery. 2. It is unclear this impacts the areas of infarct given that the left posterior cerebral artery is of fetal type. 3. Atherosclerotic changes at the proximal left internal carotid artery without significant stenosis could be a source of emboli. 4. Minimal atherosclerotic changes at the aortic arch in within the cavernous internal carotid arteries otherwise. 5. Asymmetric left-sided facet disease most notable at C3-4 and C4-5. 6.  Emphysema (ICD10-J43.9). Electronically Signed   By: San Morelle M.D.   On: 06/27/2019 14:00   Mr Brain Wo Contrast  Result Date: 06/26/2019 CLINICAL DATA:  Episodes of slurred speech and word-finding difficulty. Focal neuro deficits of greater than 6 hours. EXAM: MRI HEAD WITHOUT CONTRAST TECHNIQUE: Multiplanar, multiecho pulse sequences of the brain and  surrounding structures were obtained without intravenous contrast. COMPARISON:  CT head without contrast 06/25/2019 FINDINGS: Brain: Diffusion-weighted images demonstrate 3 separate punctate cortical areas of restricted diffusion within a posterior border zone distribution. Other acute infarcts are present. No acute hemorrhage or mass lesion is present. Periventricular and subcortical T2 hyperintensities  are mildly advanced for age. Prominent subcortical focus is present in the anterior left frontal lobe. There is a remote lacunar infarct involving the right thalamus. The internal auditory canals are within normal limits. The brainstem and cerebellum are within normal limits. Vascular: Flow is present in the major intracranial arteries. Skull and upper cervical spine: The craniocervical junction is normal. Upper cervical spine is within normal limits. Marrow signal is unremarkable. Sinuses/Orbits: The paranasal sinuses and mastoid air cells are clear. The globes and orbits are within normal limits. IMPRESSION: 1. At least 3 punctate foci of acute nonhemorrhagic infarct in a posterior border zone distribution. Question posterior circulation the disease in the neck. 2. Remote lacunar infarct of the right thalamus. 3. Scattered white matter disease is mildly advanced for age. The finding is nonspecific but can be seen in the setting of chronic microvascular ischemia, a demyelinating process such as multiple sclerosis, vasculitis, complicated migraine headaches, or as the sequelae of a prior infectious or inflammatory process. Electronically Signed   By: Marin Roberts M.D.   On: 06/26/2019 15:27        Scheduled Meds:  aspirin EC  81 mg Oral Daily   atorvastatin  20 mg Oral Daily   bisoprolol  2.5 mg Oral Daily   famotidine  20 mg Oral Daily   furosemide  40 mg Oral Daily   levothyroxine  88 mcg Oral Daily   methylPREDNISolone (SOLU-MEDROL) injection  40 mg Intravenous Q12H   nicotine  21 mg Transdermal Daily   potassium chloride  20 mEq Oral Daily   sodium chloride flush  3 mL Intravenous Q12H   spironolactone  12.5 mg Oral Daily   Continuous Infusions:  sodium chloride 250 mL (06/26/19 0959)   heparin 900 Units/hr (06/27/19 1149)     LOS: 2 days    Time spent:    Zannie Cove, MD [facility]  06/27/2019, 3:03 PM

## 2019-06-28 ENCOUNTER — Inpatient Hospital Stay (HOSPITAL_COMMUNITY): Payer: Medicaid Other

## 2019-06-28 ENCOUNTER — Other Ambulatory Visit: Payer: Self-pay

## 2019-06-28 ENCOUNTER — Encounter (HOSPITAL_COMMUNITY)
Admission: EM | Disposition: A | Discharge status: Home / Self Care | Payer: Self-pay | Source: Home / Self Care | Attending: Internal Medicine

## 2019-06-28 DIAGNOSIS — I6340 Cerebral infarction due to embolism of unspecified cerebral artery: Secondary | ICD-10-CM | POA: Insufficient documentation

## 2019-06-28 DIAGNOSIS — I2510 Atherosclerotic heart disease of native coronary artery without angina pectoris: Secondary | ICD-10-CM

## 2019-06-28 DIAGNOSIS — R4781 Slurred speech: Secondary | ICD-10-CM

## 2019-06-28 DIAGNOSIS — I214 Non-ST elevation (NSTEMI) myocardial infarction: Secondary | ICD-10-CM

## 2019-06-28 DIAGNOSIS — I639 Cerebral infarction, unspecified: Secondary | ICD-10-CM

## 2019-06-28 HISTORY — PX: LEFT HEART CATH AND CORONARY ANGIOGRAPHY: CATH118249

## 2019-06-28 LAB — BASIC METABOLIC PANEL
Anion gap: 13 (ref 5–15)
BUN: 31 mg/dL — ABNORMAL HIGH (ref 8–23)
CO2: 23 mmol/L (ref 22–32)
Calcium: 10 mg/dL (ref 8.9–10.3)
Chloride: 102 mmol/L (ref 98–111)
Creatinine, Ser: 0.93 mg/dL (ref 0.44–1.00)
GFR calc Af Amer: >60 mL/min (ref >60)
GFR calc non Af Amer: >60 mL/min (ref >60)
Glucose, Bld: 122 mg/dL — ABNORMAL HIGH (ref 70–99)
Potassium: 4.8 mmol/L (ref 3.5–5.1)
Sodium: 138 mmol/L (ref 135–145)

## 2019-06-28 LAB — CBC
HCT: 39.4 % (ref 36.0–46.0)
Hemoglobin: 12.5 g/dL (ref 12.0–15.0)
MCH: 29.1 pg (ref 26.0–34.0)
MCHC: 31.7 g/dL (ref 30.0–36.0)
MCV: 91.8 fL (ref 80.0–100.0)
Platelets: 366 10*3/uL (ref 150–400)
RBC: 4.29 MIL/uL (ref 3.87–5.11)
RDW: 14.7 % (ref 11.5–15.5)
WBC: 14.9 10*3/uL — ABNORMAL HIGH (ref 4.0–10.5)
nRBC: 0 % (ref 0.0–0.2)

## 2019-06-28 LAB — HEMOGLOBIN A1C
Hgb A1c MFr Bld: 5.4 % (ref 4.8–5.6)
Mean Plasma Glucose: 108.28 mg/dL

## 2019-06-28 LAB — LIPID PANEL
Cholesterol: 163 mg/dL (ref 0–200)
HDL: 58 mg/dL (ref >40)
LDL Cholesterol: 38 mg/dL (ref 0–99)
Total CHOL/HDL Ratio: 2.8 RATIO
Triglycerides: 334 mg/dL — ABNORMAL HIGH (ref <150)
VLDL: 67 mg/dL — ABNORMAL HIGH (ref 0–40)

## 2019-06-28 LAB — HEPARIN LEVEL (UNFRACTIONATED): Heparin Unfractionated: 0.24 IU/mL — ABNORMAL LOW (ref 0.30–0.70)

## 2019-06-28 LAB — TROPONIN I (HIGH SENSITIVITY): Troponin I (High Sensitivity): 115 ng/L (ref <18)

## 2019-06-28 SURGERY — LEFT HEART CATH AND CORONARY ANGIOGRAPHY
Anesthesia: LOCAL

## 2019-06-28 MED ORDER — LIDOCAINE HCL (PF) 1 % IJ SOLN
INTRAMUSCULAR | Status: AC
  Filled 2019-06-28: qty 30

## 2019-06-28 MED ORDER — IOHEXOL 350 MG/ML SOLN
INTRAVENOUS | Status: DC | PRN
  Administered 2019-06-28: 40 mL via INTRA_ARTERIAL

## 2019-06-28 MED ORDER — FENTANYL CITRATE (PF) 100 MCG/2ML IJ SOLN
INTRAMUSCULAR | Status: AC
  Filled 2019-06-28: qty 2

## 2019-06-28 MED ORDER — HEPARIN (PORCINE) IN NACL 1000-0.9 UT/500ML-% IV SOLN
INTRAVENOUS | Status: AC
  Filled 2019-06-28: qty 1000

## 2019-06-28 MED ORDER — VERAPAMIL HCL 2.5 MG/ML IV SOLN
INTRAVENOUS | Status: DC | PRN
  Administered 2019-06-28: 13:00:00 10 mL via INTRA_ARTERIAL

## 2019-06-28 MED ORDER — LEVOTHYROXINE SODIUM 25 MCG PO TABS
125.0000 ug | ORAL_TABLET | Freq: Every day | ORAL | Status: DC
  Administered 2019-06-29: 06:00:00 125 ug via ORAL
  Filled 2019-06-28: qty 1

## 2019-06-28 MED ORDER — NITROGLYCERIN 0.4 MG/HR TD PT24
0.4000 mg | MEDICATED_PATCH | Freq: Every day | TRANSDERMAL | Status: DC
  Administered 2019-06-28 – 2019-06-29 (×2): 0.4 mg via TRANSDERMAL
  Filled 2019-06-28 (×3): qty 1

## 2019-06-28 MED ORDER — FUROSEMIDE 20 MG PO TABS
40.0000 mg | ORAL_TABLET | Freq: Every day | ORAL | Status: DC
  Administered 2019-06-29: 08:00:00 40 mg via ORAL
  Filled 2019-06-28: qty 2

## 2019-06-28 MED ORDER — SODIUM CHLORIDE 0.9% FLUSH: 3.0000 mL | INTRAVENOUS | Status: DC | PRN

## 2019-06-28 MED ORDER — HEPARIN (PORCINE) IN NACL 1000-0.9 UT/500ML-% IV SOLN
INTRAVENOUS | Status: DC | PRN
  Administered 2019-06-28 (×2): 500 mL

## 2019-06-28 MED ORDER — SODIUM CHLORIDE 0.9 % IV SOLN: 250.0000 mL | INTRAVENOUS | Status: DC | PRN

## 2019-06-28 MED ORDER — ASPIRIN 81 MG PO CHEW: 81.0000 mg | CHEWABLE_TABLET | ORAL | Status: DC

## 2019-06-28 MED ORDER — SODIUM CHLORIDE 0.9% FLUSH
3.0000 mL | Freq: Two times a day (BID) | INTRAVENOUS | Status: DC
  Administered 2019-06-28 – 2019-06-29 (×3): 3 mL via INTRAVENOUS

## 2019-06-28 MED ORDER — LIDOCAINE HCL (PF) 1 % IJ SOLN
INTRAMUSCULAR | Status: DC | PRN
  Administered 2019-06-28: 5 mL via SUBCUTANEOUS

## 2019-06-28 MED ORDER — FENTANYL CITRATE (PF) 100 MCG/2ML IJ SOLN
INTRAMUSCULAR | Status: DC | PRN
  Administered 2019-06-28: 25 ug via INTRAVENOUS

## 2019-06-28 MED ORDER — ROSUVASTATIN CALCIUM 20 MG PO TABS
20.0000 mg | ORAL_TABLET | Freq: Every day | ORAL | Status: DC
  Administered 2019-06-28 – 2019-06-29 (×2): 20 mg via ORAL
  Filled 2019-06-28 (×2): qty 1

## 2019-06-28 MED ORDER — ENOXAPARIN SODIUM 40 MG/0.4ML ~~LOC~~ SOLN
40.0000 mg | SUBCUTANEOUS | Status: DC
  Administered 2019-06-29: 08:00:00 40 mg via SUBCUTANEOUS
  Filled 2019-06-28: qty 0.4

## 2019-06-28 MED ORDER — SODIUM CHLORIDE 0.9 % IV SOLN
INTRAVENOUS | Status: DC
  Administered 2019-06-28: 11:00:00 via INTRAVENOUS

## 2019-06-28 MED ORDER — SODIUM CHLORIDE 0.9% FLUSH
3.0000 mL | Freq: Two times a day (BID) | INTRAVENOUS | Status: DC
  Administered 2019-06-28 – 2019-06-29 (×2): 3 mL via INTRAVENOUS

## 2019-06-28 MED ORDER — HEPARIN SODIUM (PORCINE) 1000 UNIT/ML IJ SOLN
INTRAMUSCULAR | Status: AC
  Filled 2019-06-28: qty 1

## 2019-06-28 MED ORDER — HEPARIN SODIUM (PORCINE) 1000 UNIT/ML IJ SOLN
INTRAMUSCULAR | Status: DC | PRN
  Administered 2019-06-28: 2500 [IU] via INTRAVENOUS

## 2019-06-28 MED ORDER — HYDRALAZINE HCL 20 MG/ML IJ SOLN: 10.0000 mg | INTRAMUSCULAR | Status: AC | PRN

## 2019-06-28 MED ORDER — CLOPIDOGREL BISULFATE 75 MG PO TABS
75.0000 mg | ORAL_TABLET | Freq: Every day | ORAL | Status: DC
  Administered 2019-06-28 – 2019-06-29 (×2): 75 mg via ORAL
  Filled 2019-06-28 (×2): qty 1

## 2019-06-28 MED ORDER — VERAPAMIL HCL 2.5 MG/ML IV SOLN
INTRAVENOUS | Status: AC
  Filled 2019-06-28: qty 2

## 2019-06-28 SURGICAL SUPPLY — 11 items
CATH 5FR JL3.5 JR4 ANG PIG MP (CATHETERS) ×1 IMPLANT
DEVICE RAD TR BAND REGULAR (VASCULAR PRODUCTS) ×1 IMPLANT
GLIDESHEATH SLEND SS 6F .021 (SHEATH) ×1 IMPLANT
GUIDEWIRE INQWIRE 1.5J.035X260 (WIRE) IMPLANT
INQWIRE 1.5J .035X260CM (WIRE) ×2
KIT HEART LEFT (KITS) ×2 IMPLANT
PACK CARDIAC CATHETERIZATION (CUSTOM PROCEDURE TRAY) ×2 IMPLANT
SHEATH PROBE COVER 6X72 (BAG) ×1 IMPLANT
SYR MEDRAD MARK 7 150ML (SYRINGE) ×2 IMPLANT
TRANSDUCER W/STOPCOCK (MISCELLANEOUS) ×2 IMPLANT
TUBING CIL FLEX 10 FLL-RA (TUBING) ×2 IMPLANT

## 2019-06-28 NOTE — Progress Notes (Signed)
ANTICOAGULATION CONSULT NOTE - Follow Up Consult  Pharmacy Consult for heparin Indication: chest pain/ACS  Labs: Recent Labs    06/25/19 1031  06/25/19 2002 06/25/19 2234 06/26/19 0225 06/26/19 0086  06/26/19 1844 06/27/19 0348 06/28/19 0353  HGB  --   --   --   --   --  12.2  --   --  12.2 12.5  HCT  --   --   --   --   --  36.8  --   --  38.2 39.4  PLT  --   --   --   --   --  385  --   --  418* 366  HEPARINUNFRC  --    < >  --   --   --  0.34   < > 0.30 0.35 0.24*  CREATININE  --   --   --   --  0.78  --   --   --  0.83 0.93  TROPONINIHS 337*  --  325* 283*  --   --   --   --   --   --    < > = values in this interval not displayed.    Assessment: 63yo female subtherapeutic on heparin after two levels at goal and trending down; no gtt issues or signs of bleeding per RN.  Of note stroke was noted on MRI with concern for cardioembolic source; will decrease heparin goal.  Goal of Therapy:  Heparin level 0.3-0.5 units/ml   Plan:  Will increase heparin gtt by 2 units/kg/hr to 1000 units/hr and check level in 8 hours.    Wynona Neat, PharmD, BCPS  06/28/2019,5:25 AM

## 2019-06-28 NOTE — Progress Notes (Signed)
OT Cancellation Note  Patient Details Name: Annette Morrison MRN: [id number] DOB: [birth year]   Cancelled Treatment:    Reason Eval/Treat Not Completed: Patient at procedure or test/ unavailable pt in surgery for left heart cath and coronary angiography. Will return at later time as schedule permits and pt is appropriate.   Dorinda Hill OTR/L Acute Rehabilitation Services Office: [place] 06/28/2019, 1:18 PM

## 2019-06-28 NOTE — Progress Notes (Addendum)
Progress Note  Patient Name: Annette Morrison Date of Encounter: 06/28/2019  Primary Cardiologist: Nona Dell, MD   Subjective   Complains of chest tightness. Breathing litter improved since admit. Report word finding improving. "head is getting better".  According to her report, symptoms are really intermittent, it is hard to tell because of her verbal issues whether or not her chest discomfort is new now than it was yesterday.  However it is persistent.  She says her breathing improves with coughing  Inpatient Medications    Scheduled Meds:  aspirin EC  81 mg Oral Daily   atorvastatin  20 mg Oral Daily   bisoprolol  2.5 mg Oral Daily   famotidine  20 mg Oral Daily   furosemide  40 mg Oral Daily   levothyroxine  88 mcg Oral Daily   nicotine  21 mg Transdermal Daily   nitroGLYCERIN  0.4 mg Transdermal Daily   potassium chloride  20 mEq Oral Daily   predniSONE  40 mg Oral Q breakfast   sodium chloride flush  3 mL Intravenous Q12H   spironolactone  12.5 mg Oral Daily   Continuous Infusions:  sodium chloride 250 mL (06/26/19 0959)   heparin 1,000 Units/hr (06/28/19 0603)   PRN Meds: sodium chloride, acetaminophen, HYDROcodone-acetaminophen, ipratropium-albuterol, ondansetron (ZOFRAN) IV, sodium chloride flush   Vital Signs    Vitals:   06/27/19 2137 06/28/19 0204 06/28/19 0600 06/28/19 0608  BP:  106/68 115/75   Pulse: 86 78 74   Resp: 15 19 20    Temp: 97.7 F (36.5 C)   97.6 F (36.4 C)  TempSrc: Oral   Oral  SpO2: 97% 96% 98%   Weight:   48.7 kg   Height:        Intake/Output Summary (Last 24 hours) at 06/28/2019 0803 Last data filed at 06/28/2019 0630 Gross per 24 hour  Intake 1014.95 ml  Output 2150 ml  Net -1135.05 ml   Last 3 Weights 06/28/2019 06/27/2019 06/26/2019  Weight (lbs) 107 lb 6.4 oz 107 lb 5.8 oz 118 lb 13.3 oz  Weight (kg) 48.716 kg 48.7 kg 53.9 kg      Telemetry    SR at rate of 80s - Personally Reviewed  ECG      EKG reviewed today is grossly abnormal compared to prior: Sinus rhythm with hyperacute T waves with subtle ST elevations in V3 through V6 with T wave inversions in aVL, V1 and V2.  The hyperacute T wave changes with subtle ST elevation do have appearance of repolarization, but are acutely different compared to 2 days ago.   Physical Exam   GEN: thin frail female in no acute distress.   Neck: No JVD Cardiac: RRR, no murmurs, rubs, or gallops.  Respiratory: Clear to auscultation bilaterally. GI: Soft, nontender, non-distended  MS: No edema; No deformity. Neuro:  Nonfocal -definitely has dysphasia with difficulty with word finding Psych: Normal affect   Labs    High Sensitivity Troponin:   Recent Labs  Lab 06/25/19 0803 06/25/19 1031 06/25/19 2002 06/25/19 2234  TROPONINIHS 371* 337* 325* 283*      Chemistry Recent Labs  Lab 06/25/19 0803 06/26/19 0225 06/27/19 0348 06/28/19 0353  NA 143 139 139 138  K 3.5 4.4 4.5 4.8  CL 107 102 100 102  CO2 22 23 24 23   GLUCOSE 135* 125* 130* 122*  BUN 12 12 24* 31*  CREATININE 0.79 0.78 0.83 0.93  CALCIUM 9.2 9.5 9.8 10.0  PROT 7.4  --   --   --  ALBUMIN 3.0*  --   --   --   AST 17  --   --   --   ALT <5  --   --   --   ALKPHOS 114  --   --   --   BILITOT 0.1*  --   --   --   GFRNONAA >60 >60 >60 >60  GFRAA >60 >60 >60 >60  ANIONGAP 14 14 15 13      Hematology Recent Labs  Lab 06/26/19 0608 06/27/19 0348 06/28/19 0353  WBC 12.1* 14.3* 14.9*  RBC 4.13 4.18 4.29  HGB 12.2 12.2 12.5  HCT 36.8 38.2 39.4  MCV 89.1 91.4 91.8  MCH 29.5 29.2 29.1  MCHC 33.2 31.9 31.7  RDW 14.4 14.7 14.7  PLT 385 418* 366    BNP Recent Labs  Lab 06/25/19 0803  BNP 649.0*     Radiology    Ct Angio Head W Or Wo Contrast  Result Date: 06/27/2019 CLINICAL DATA:  Stroke, follow-up. Right border zone punctate infarcts. Remote lacunar infarct of the right thalamus. EXAM: CT ANGIOGRAPHY HEAD AND NECK TECHNIQUE: Multidetector CT imaging  of the head and neck was performed using the standard protocol during bolus administration of intravenous contrast. Multiplanar CT image reconstructions and MIPs were obtained to evaluate the vascular anatomy. Carotid stenosis measurements (when applicable) are obtained utilizing NASCET criteria, using the distal internal carotid diameter as the denominator. CONTRAST:  50mL OMNIPAQUE IOHEXOL 350 MG/ML SOLN COMPARISON:  MR head without contrast 06/26/2019. CT head without contrast 06/25/2019. FINDINGS: CT HEAD FINDINGS Brain: The punctate acute infarcts on MRI are below the resolution of CT. No acute cortical infarcts are evident. Basal ganglia are intact. Scattered white matter changes are again noted. The ventricles are of normal size. No significant extraaxial fluid collection is present. The brainstem and cerebellum are within normal limits. Vascular: Atherosclerotic calcifications are present within the cavernous internal carotid arteries. There is no hyperdense vessel. Skull: Calvarium is intact. No focal lytic or blastic lesions are present. Sinuses: The paranasal sinuses and mastoid air cells are clear. Orbits: The globes and orbits are within normal limits. Review of the MIP images confirms the above findings CTA NECK FINDINGS Aortic arch: A 4 vessel arch configuration is present. There dense calcifications with a high-grade stenosis at the origin of the left vertebral artery. Minimal calcifications are present at the left subclavian artery without a significant stenosis. Minimal calcifications are present at the innominate artery without significant stenosis. Right carotid system: Right common carotid artery is within normal limits. Minimal irregularity is present at the bifurcation and proximal right ICA without a significant stenosis. The cervical right ICA is otherwise normal. Left carotid system: The left common carotid artery is within normal limits. Atherosclerotic changes are present at the  bifurcation without a significant stenosis. There is a posteromedial plaque in the proximal left ICA without a significant stenosis. There is slight narrowing of the distal left cervical ICA just below the skull base without a significant stenosis. Vertebral arteries: The right vertebral artery is the dominant vessel. It originates from the subclavian artery. There are no tandem stenoses in the left vertebral artery. There is no focal stenosis of the right vertebral artery in the neck. Skeleton: Asymmetric left-sided facet disease is present in the cervical spine, most evident at C3-4 and C4-5 with foraminal narrowing at these levels. Vertebral body heights and alignment are maintained. No focal lytic or blastic lesions are present. Other neck: The soft tissues  of the neck are otherwise unremarkable. There is mild heterogeneity of the thyroid without a dominant lesion. Salivary glands are normal. No significant adenopathy is present. Upper chest: Centrilobular emphysematous changes are present. No focal nodule or mass lesion is evident. There is scarring at the right apex. Review of the MIP images confirms the above findings CTA HEAD FINDINGS Anterior circulation: Atherosclerotic changes are present in the cavernous internal carotid arteries bilaterally. There is no significant stenosis relative to the more distal vessels. The ICA termini are within normal limits bilaterally. The A1 and M1 segments are normal. MCA bifurcations are intact. ACA and MCA branch vessels are normal. Posterior circulation: Right vertebral artery is the dominant vessel. There is mild narrowing at the dural margin of the right vertebral artery without a significant stenosis relative to the more distal vessel. Vertebrobasilar junction is normal. There is mild narrowing of proximal basilar artery as well. The right posterior cerebral artery originates from the basilar tip. The left posterior cerebral artery is of fetal type. There is some  irregularity of distal PCA branch vessels without a significant proximal stenosis or occlusion. Venous sinuses: The dural sinuses are patent. Straight sinus deep cerebral veins are intact. Cortical veins are unremarkable. Anatomic variants: Fetal type left posterior cerebral artery. Review of the MIP images confirms the above findings IMPRESSION: 1. 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 There is dense calcification and high-grade stenosis at the origin of the left vertebral artery. 2. It is unclear this impacts the areas of infarct given that the left posterior cerebral artery is of fetal type. 3. Atherosclerotic changes at the proximal left internal carotid artery without significant stenosis could be a source of emboli. 4. Minimal atherosclerotic changes at the aortic arch in within the cavernous internal carotid arteries otherwise. 5. Asymmetric left-sided facet disease most notable at C3-4 and C4-5. 6.  Emphysema (ICD10-J43.9). Electronically Signed   By: Marin Roberts M.D.   On: 06/27/2019 14:00   Ct Angio Neck W Or Wo Contrast  Result Date: 06/27/2019 CLINICAL DATA:  Stroke, follow-up. Right border zone punctate infarcts. Remote lacunar infarct of the right thalamus. EXAM: CT ANGIOGRAPHY HEAD AND NECK TECHNIQUE: Multidetector CT imaging of the head and neck was performed using the standard protocol during bolus administration of intravenous contrast. Multiplanar CT image reconstructions and MIPs were obtained to evaluate the vascular anatomy. Carotid stenosis measurements (when applicable) are obtained utilizing NASCET criteria, using the distal internal carotid diameter as the denominator. CONTRAST:  50mL OMNIPAQUE IOHEXOL 350 MG/ML SOLN COMPARISON:  MR head without contrast 06/26/2019. CT head without contrast 06/25/2019. FINDINGS: CT HEAD FINDINGS Brain: The punctate acute infarcts on MRI are below the resolution of CT. No acute cortical infarcts are evident. Basal ganglia are  intact. Scattered white matter changes are again noted. The ventricles are of normal size. No significant extraaxial fluid collection is present. The brainstem and cerebellum are within normal limits. Vascular: Atherosclerotic calcifications are present within the cavernous internal carotid arteries. There is no hyperdense vessel. Skull: Calvarium is intact. No focal lytic or blastic lesions are present. Sinuses: The paranasal sinuses and mastoid air cells are clear. Orbits: The globes and orbits are within normal limits. Review of the MIP images confirms the above findings CTA NECK FINDINGS Aortic arch: A 4 vessel arch configuration is present. There dense calcifications with a high-grade stenosis at the origin of the left vertebral artery. Minimal calcifications are present at the left subclavian artery without a significant stenosis. Minimal calcifications  are present at the innominate artery without significant stenosis. Right carotid system: Right common carotid artery is within normal limits. Minimal irregularity is present at the bifurcation and proximal right ICA without a significant stenosis. The cervical right ICA is otherwise normal. Left carotid system: The left common carotid artery is within normal limits. Atherosclerotic changes are present at the bifurcation without a significant stenosis. There is a posteromedial plaque in the proximal left ICA without a significant stenosis. There is slight narrowing of the distal left cervical ICA just below the skull base without a significant stenosis. Vertebral arteries: The right vertebral artery is the dominant vessel. It originates from the subclavian artery. There are no tandem stenoses in the left vertebral artery. There is no focal stenosis of the right vertebral artery in the neck. Skeleton: Asymmetric left-sided facet disease is present in the cervical spine, most evident at C3-4 and C4-5 with foraminal narrowing at these levels. Vertebral body heights  and alignment are maintained. No focal lytic or blastic lesions are present. Other neck: The soft tissues of the neck are otherwise unremarkable. There is mild heterogeneity of the thyroid without a dominant lesion. Salivary glands are normal. No significant adenopathy is present. Upper chest: Centrilobular emphysematous changes are present. No focal nodule or mass lesion is evident. There is scarring at the right apex. Review of the MIP images confirms the above findings CTA HEAD FINDINGS Anterior circulation: Atherosclerotic changes are present in the cavernous internal carotid arteries bilaterally. There is no significant stenosis relative to the more distal vessels. The ICA termini are within normal limits bilaterally. The A1 and M1 segments are normal. MCA bifurcations are intact. ACA and MCA branch vessels are normal. Posterior circulation: Right vertebral artery is the dominant vessel. There is mild narrowing at the dural margin of the right vertebral artery without a significant stenosis relative to the more distal vessel. Vertebrobasilar junction is normal. There is mild narrowing of proximal basilar artery as well. The right posterior cerebral artery originates from the basilar tip. The left posterior cerebral artery is of fetal type. There is some irregularity of distal PCA branch vessels without a significant proximal stenosis or occlusion. Venous sinuses: The dural sinuses are patent. Straight sinus deep cerebral veins are intact. Cortical veins are unremarkable. Anatomic variants: Fetal type left posterior cerebral artery. Review of the MIP images confirms the above findings IMPRESSION: 1. 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 There is dense calcification and high-grade stenosis at the origin of the left vertebral artery. 2. It is unclear this impacts the areas of infarct given that the left posterior cerebral artery is of fetal type. 3. Atherosclerotic changes at the proximal left  internal carotid artery without significant stenosis could be a source of emboli. 4. Minimal atherosclerotic changes at the aortic arch in within the cavernous internal carotid arteries otherwise. 5. Asymmetric left-sided facet disease most notable at C3-4 and C4-5. 6.  Emphysema (ICD10-J43.9). Electronically Signed   By: Marin Robertshristopher  Mattern M.D.   On: 06/27/2019 14:00   Mr Brain Wo Contrast  Result Date: 06/26/2019 CLINICAL DATA:  Episodes of slurred speech and word-finding difficulty. Focal neuro deficits of greater than 6 hours. EXAM: MRI HEAD WITHOUT CONTRAST TECHNIQUE: Multiplanar, multiecho pulse sequences of the brain and surrounding structures were obtained without intravenous contrast. COMPARISON:  CT head without contrast 06/25/2019 FINDINGS: Brain: Diffusion-weighted images demonstrate 3 separate punctate cortical areas of restricted diffusion within a posterior border zone distribution. Other acute infarcts are present. No acute  hemorrhage or mass lesion is present. Periventricular and subcortical T2 hyperintensities are mildly advanced for age. Prominent subcortical focus is present in the anterior left frontal lobe. There is a remote lacunar infarct involving the right thalamus. The internal auditory canals are within normal limits. The brainstem and cerebellum are within normal limits. Vascular: Flow is present in the major intracranial arteries. Skull and upper cervical spine: The craniocervical junction is normal. Upper cervical spine is within normal limits. Marrow signal is unremarkable. Sinuses/Orbits: The paranasal sinuses and mastoid air cells are clear. The globes and orbits are within normal limits. IMPRESSION: 1. At least 3 punctate foci of acute nonhemorrhagic infarct in a posterior border zone distribution. Question posterior circulation the disease in the neck. 2. Remote lacunar infarct of the right thalamus. 3. Scattered white matter disease is mildly advanced for age. The finding  is nonspecific but can be seen in the setting of chronic microvascular ischemia, a demyelinating process such as multiple sclerosis, vasculitis, complicated migraine headaches, or as the sequelae of a prior infectious or inflammatory process. Electronically Signed   By: Marin Robertshristopher  Mattern M.D.   On: 06/26/2019 15:27    Cardiac Studies   Echo 06/25/2019 IMPRESSIONS   1. The left ventricle has mild-moderately reduced systolic function, with an ejection fraction of 40-45%. The cavity size was mildly dilated. Left ventricular diastolic Doppler parameters are consistent with impaired relaxation.  2. There is akinesis of the left ventricular, mid septal wall.  3. There iw akinesis of the left ventricular, basal-mid inferolateral wall.  4. The right ventricle has normal systolic function. The cavity was normal. There is no increase in right ventricular wall thickness. Right ventricular systolic pressure is normal with an estimated pressure of 22.7 mmHg.  5. The aortic valve is tricuspid. Mild calcification of the aortic valve. Mild aortic annular calcification noted.  6. The mitral valve is grossly normal. There is mild mitral annular calcification present.  7. The tricuspid valve is grossly normal.  8. The aorta is normal unless otherwise noted.  9. Evidence of atrial level shunting detected by color flow Doppler.  Patient Profile     63 y.o. female with hx of hypertension, HLD, hypothyroidism and long standing tobacco smoking admitted with COPD exacerbation. She was found to have newly documented cardiomyopathy with wall motion abnormality suggesting ischemic etiology. Hx of difficulties with speech and word finding problem>> work up reveled subacute stroke.   Assessment & Plan    1. Acute systolic and diastolic CHF - Echo showed LVEF of 40-45%, mild DD ad wall motion abnormality. Suspect ischemic etiology. Per neugology "this should not prevent her from getting a heart cath. Embolic nature of  her strokes do raise suspicion for cardioembolic source, although atherosclerotic disease is also likely". - BNP 649 on admit. Diuresed 3.4L so far. Weight down 13 lb (120>>>107lb) - She appears euvolemic - Continue Bisoprolol 2.5mg  qd, Lasix 40mg  qd (converted to oral) and Spironolactone 12.5mg  qd - Consider ACE/ARB at discharge depending on blood pressure.  Currently pressures would not tolerate  2. Chest pain and elevated troponin - on admit patient reports improved/resolved CP after SL nitro - Hs-Trop trend was low 371* 337* 325* 283*  - She is treated with heparin since admit - Today  She complains of chest tightness >> will start nitro patch and EKG - Will review further plan with Dr. Herbie BaltimoreHarding  3. Acute/Subacute stroke - Reports 3 weeks hx of difficulties with speech and word finding problem - Per Neurolgoy -current  neurologic symptoms should not preclude cardiac catheterization - Okay to change cardiac medications if permissive hypertension needed - Currently BP soft low  4. HTN - soft BP - AS above -Cozaar on hold  5. Tobacco smoking - Encouraged cessation   6. HLD - 07/29/2018: Cholesterol 349; HDL 48; LDL Cholesterol UNABLE TO CALCULATE IF TRIGLYCERIDE OVER 400 mg/dL; Triglycerides 720; VLDL UNABLE TO CALCULATE IF TRIGLYCERIDE OVER 400 mg/dL  - Will repeat lipid panel -Increase Lipitor to 80 mg mg for now    For questions or updates, please contact [place] Please consult [email or web address] for contact info under        Signed, Leanor Kail, PA  06/28/2019, 8:03 AM     ATTENDING ATTESTATION  I have seen, examined and evaluated the patient this AM along with Mr. Curly [place], [place].  After reviewing all the available data and chart, we discussed the patients laboratory, study & physical findings as well as symptoms in detail. I agree with his findings, examination as well as impression recommendations as per our discussion.    Attending adjustments noted in  italics.  I am quite concerned about Ms. Broaddus's change in EKGs.  We do have confirmation from neurology that they felt like her neuro symptoms have been ongoing for several weeks now suspect subacute event.  Although they said this did not explain her verbal difficulties.  With these EKG changes, however worried about potential worsening coronary artery disease and I think it is probably prudent for [place] to consider cardiac catheterization.  I discussed with Dr. Saunders Revel, after reviewing EKG with him and determined that there is definite concern for potential progression of CAD.  EKG does not meet STEMI criteria, however does show signs concerning for possible early stage anterolateral STEMI.  As such, we have chosen to proceed with cardiac catheterization at the next scheduled slot (all 3 labs have patients on the table, she will be next for Dr. Saunders Revel.  I feel this is relatively safe given the fact that she really has stable discomfort at this time.    Glenetta Hew, M.D., M.S. Interventional Cardiologist   Pager # [phone] Phone # [phone] [address] [place], [place] [address]

## 2019-06-28 NOTE — Progress Notes (Signed)
PROGRESS NOTE    Annette IdeBrenda Morrison  ZOX:[id number] DOB: [birth year] DOA: 06/25/2019 PCP: Patient, No Pcp Per  Brief Narrative: 63 year old female history of hypertension dyslipidemia tobacco abuse COPD, hypothyroidism presented to the emergency room with progressive dyspnea on exertion for 2 to 3 weeks worsened the night prior to admission, chest x-ray indicated CHF, she was also noted to be wheezing, on work-up noted to have mildly elevated troponin with nonspecific EKG, seen by cardiology 2D echo in the emergency room showed depressed EF of 40% with new wall motion abnormalities, started on diuretics, IV heparin and transferred to [facility] -Also reported ongoing word finding difficulties for 2 to 3 weeks prior to admission  Assessment & Plan:   Acute respiratory failure with hypoxia -Primarily from CHF and COPD exacerbation -As below  Acute systolic CHF/STEMI now -EF 40 to 45% with multiple new wall motion abnormalities -Continue aspirin, IV Lasix today, started on low-dose bisoprolol -Clinically much improved with diuresis -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, this am with chest pain and EKG s/o STEMI -ok to have left heart catheterization per neurology -Cards following  Mild COPD exacerbation -Was started on steroids and nebulizations on admission 8/21 -No further wheezing at this time, transition to prednisone taper, duonebs  Subacute embolic CVA  -Patient reported slurred speech with word finding difficulty started 2 to 3 weeks ago -CT was on admission was unrevealing, speech considerably better -MRI brain noted 3 punctate posterior border zone infarcts and right thalamus lacunar infarct, suspected to be embolic from low EF -Neurology consulted, continue aspirin, cardiac work-up and recommended 30-day monitor discharge -CTA head and neck noted dense calcification and high-grade stenosis origin L VA. atherosclerosis of proximal L ICA -2D echocardiogram as noted above with EF  of 40 to 45% with new wall motion abnormalities -Continue aspirin, high intensity statin -PT, OT, SLP evaluations  Dyslipidemia -Continue statin  Hypothyroidism -Continue Synthroid  DVT prophylaxis: Heparin infusion Code Status: Full code Family Communication:  No family at bedside Disposition Plan:  Pending above work-up  Consultants:   Cardiology  Neurology   Procedures:   Antimicrobials:    Subjective: -Had some mild chest pressure this morning, denies any dyspnea at this time  Objective: Vitals:   06/28/19 0600 06/28/19 0608 06/28/19 0907 06/28/19 0926  BP: 115/75  116/84 108/81  Pulse: 74  74   Resp: 20   12  Temp:  97.6 F (36.4 C) 97.6 F (36.4 C)   TempSrc:  Oral Oral   SpO2: 98%  99%   Weight: 48.7 kg     Height:        Intake/Output Summary (Last 24 hours) at 06/28/2019 1233 Last data filed at 06/28/2019 0800 Gross per 24 hour  Intake 531.95 ml  Output 1400 ml  Net -868.05 ml   Filed Weights   06/26/19 0449 06/27/19 0347 06/28/19 0600  Weight: 53.9 kg 48.7 kg 48.7 kg    Examination:  Gen: Awake, Alert, Oriented X 3, no distress HEENT: PERRLA, Neck supple, no JVD Lungs: Improved air movement, no expiratory wheezes today, few basilar crackles CVS: RRR,No Gallops,Rubs or new Murmurs Abd: soft, Non tender, non distended, BS present Extremities: No edema Skin: no new rashes psychiatry: Judgement and insight appear normal. Mood & affect appropriate.     Data Reviewed:   CBC: Recent Labs  Lab 06/25/19 0803 06/26/19 0608 06/27/19 0348 06/28/19 0353  WBC 16.2* 12.1* 14.3* 14.9*  NEUTROABS 13.7*  --   --   --  HGB 12.3 12.2 12.2 12.5  HCT 39.4 36.8 38.2 39.4  MCV 93.4 89.1 91.4 91.8  PLT 400 385 418* 366   Basic Metabolic Panel: Recent Labs  Lab 06/25/19 0803 06/26/19 0225 06/27/19 0348 06/28/19 0353  NA 143 139 139 138  K 3.5 4.4 4.5 4.8  CL 107 102 100 102  CO2 22 23 24 23   GLUCOSE 135* 125* 130* 122*  BUN 12 12 24*  31*  CREATININE 0.79 0.78 0.83 0.93  CALCIUM 9.2 9.5 9.8 10.0   GFR: Estimated Creatinine Clearance: 48.8 mL/min (by C-G formula based on SCr of 0.93 mg/dL). Liver Function Tests: Recent Labs  Lab 06/25/19 0803  AST 17  ALT <5  ALKPHOS 114  BILITOT 0.1*  PROT 7.4  ALBUMIN 3.0*   No results for input(s): LIPASE, AMYLASE in the last 168 hours. No results for input(s): AMMONIA in the last 168 hours. Coagulation Profile: No results for input(s): INR, PROTIME in the last 168 hours. Cardiac Enzymes: No results for input(s): CKTOTAL, CKMB, CKMBINDEX, TROPONINI in the last 168 hours. BNP (last 3 results) No results for input(s): PROBNP in the last 8760 hours. HbA1C: Recent Labs    06/28/19 0353  HGBA1C 5.4   CBG: No results for input(s): GLUCAP in the last 168 hours. Lipid Profile: Recent Labs    06/28/19 0353  CHOL 163  HDL 58  LDLCALC 38  TRIG 334*  CHOLHDL 2.8   Thyroid Function Tests: No results for input(s): TSH, T4TOTAL, FREET4, T3FREE, THYROIDAB in the last 72 hours. Anemia Panel: No results for input(s): VITAMINB12, FOLATE, FERRITIN, TIBC, IRON, RETICCTPCT in the last 72 hours. Urine analysis:    Component Value Date/Time   COLORURINE YELLOW 03/11/2019 2149   APPEARANCEUR HAZY (A) 03/11/2019 2149   LABSPEC 1.015 03/11/2019 2149   PHURINE 7.0 03/11/2019 2149   GLUCOSEU NEGATIVE 03/11/2019 2149   HGBUR MODERATE (A) 03/11/2019 2149   BILIRUBINUR NEGATIVE 03/11/2019 2149   KETONESUR NEGATIVE 03/11/2019 2149   PROTEINUR 30 (A) 03/11/2019 2149   NITRITE NEGATIVE 03/11/2019 2149   LEUKOCYTESUR NEGATIVE 03/11/2019 2149   Sepsis Labs: @LABRCNTIP (procalcitonin:4,lacticidven:4)  ) Recent Results (from the past 240 hour(s))  SARS Coronavirus 2 [facility] order, Performed in [facility] hospital lab) Nasopharyngeal Nasopharyngeal Swab     Status: None   Collection Time: 06/25/19  6:09 AM   Specimen: Nasopharyngeal Swab  Result Value Ref Range Status   SARS  Coronavirus 2 NEGATIVE NEGATIVE Final    Comment: (NOTE) If result is NEGATIVE SARS-CoV-2 target nucleic acids are NOT DETECTED. The SARS-CoV-2 RNA is generally detectable in upper and lower  respiratory specimens during the acute phase of infection. The lowest  concentration of SARS-CoV-2 viral copies this assay can detect is 250  copies / mL. A negative result does not preclude SARS-CoV-2 infection  and should not be used as the sole basis for treatment or other  patient management decisions.  A negative result may occur with  improper specimen collection / handling, submission of specimen other  than nasopharyngeal swab, presence of viral mutation(s) within the  areas targeted by this assay, and inadequate number of viral copies  (<250 copies / mL). A negative result must be combined with clinical  observations, patient history, and epidemiological information. If result is POSITIVE SARS-CoV-2 target nucleic acids are DETECTED. The SARS-CoV-2 RNA is generally detectable in upper and lower  respiratory specimens dur ing the acute phase of infection.  Positive  results are indicative of active infection with  SARS-CoV-2.  Clinical  correlation with patient history and other diagnostic information is  necessary to determine patient infection status.  Positive results do  not rule out bacterial infection or co-infection with other viruses. If result is PRESUMPTIVE POSTIVE SARS-CoV-2 nucleic acids MAY BE PRESENT.   A presumptive positive result was obtained on the submitted specimen  and confirmed on repeat testing.  While 2019 novel coronavirus  (SARS-CoV-2) nucleic acids may be present in the submitted sample  additional confirmatory testing may be necessary for epidemiological  and / or clinical management purposes  to differentiate between  SARS-CoV-2 and other Sarbecovirus currently known to infect humans.  If clinically indicated additional testing with an alternate test    methodology [phone]) is advised. The SARS-CoV-2 RNA is generally  detectable in upper and lower respiratory sp ecimens during the acute  phase of infection. The expected result is Negative. Fact Sheet for Patients:  StrictlyIdeas.no Fact Sheet for Healthcare Providers: BankingDealers.co.za This test is not yet approved or cleared by the [place] FDA and has been authorized for detection and/or diagnosis of SARS-CoV-2 by FDA under an Emergency Use Authorization (EUA).  This EUA will remain in effect (meaning this test can be used) for the duration of the COVID-19 declaration under Section 564(b)(1) of the Act, 21 U.S.C. section 360bbb-3(b)(1), unless the authorization is terminated or revoked sooner. Performed at [facility], [address] [place], [place] [address]          Radiology Studies: Ct Angio Head W Or Wo Contrast  Result Date: 06/27/2019 CLINICAL DATA:  Stroke, follow-up. Right border zone punctate infarcts. Remote lacunar infarct of the right thalamus. EXAM: CT ANGIOGRAPHY HEAD AND NECK TECHNIQUE: Multidetector CT imaging of the head and neck was performed using the standard protocol during bolus administration of intravenous contrast. Multiplanar CT image reconstructions and MIPs were obtained to evaluate the vascular anatomy. Carotid stenosis measurements (when applicable) are obtained utilizing NASCET criteria, using the distal internal carotid diameter as the denominator. CONTRAST:  58mL OMNIPAQUE IOHEXOL 350 MG/ML SOLN COMPARISON:  MR head without contrast 06/26/2019. CT head without contrast 06/25/2019. FINDINGS: CT HEAD FINDINGS Brain: The punctate acute infarcts on MRI are below the resolution of CT. No acute cortical infarcts are evident. Basal ganglia are intact. Scattered white matter changes are again noted. The ventricles are of normal size. No significant extraaxial fluid collection is present. The brainstem  and cerebellum are within normal limits. Vascular: Atherosclerotic calcifications are present within the cavernous internal carotid arteries. There is no hyperdense vessel. Skull: Calvarium is intact. No focal lytic or blastic lesions are present. Sinuses: The paranasal sinuses and mastoid air cells are clear. Orbits: The globes and orbits are within normal limits. Review of the MIP images confirms the above findings CTA NECK FINDINGS Aortic arch: A 4 vessel arch configuration is present. There dense calcifications with a high-grade stenosis at the origin of the left vertebral artery. Minimal calcifications are present at the left subclavian artery without a significant stenosis. Minimal calcifications are present at the innominate artery without significant stenosis. Right carotid system: Right common carotid artery is within normal limits. Minimal irregularity is present at the bifurcation and proximal right ICA without a significant stenosis. The cervical right ICA is otherwise normal. Left carotid system: The left common carotid artery is within normal limits. Atherosclerotic changes are present at the bifurcation without a significant stenosis. There is a posteromedial plaque in the proximal left ICA without a significant stenosis. There is slight narrowing  of the distal left cervical ICA just below the skull base without a significant stenosis. Vertebral arteries: The right vertebral artery is the dominant vessel. It originates from the subclavian artery. There are no tandem stenoses in the left vertebral artery. There is no focal stenosis of the right vertebral artery in the neck. Skeleton: Asymmetric left-sided facet disease is present in the cervical spine, most evident at C3-4 and C4-5 with foraminal narrowing at these levels. Vertebral body heights and alignment are maintained. No focal lytic or blastic lesions are present. Other neck: The soft tissues of the neck are otherwise unremarkable. There is mild  heterogeneity of the thyroid without a dominant lesion. Salivary glands are normal. No significant adenopathy is present. Upper chest: Centrilobular emphysematous changes are present. No focal nodule or mass lesion is evident. There is scarring at the right apex. Review of the MIP images confirms the above findings CTA HEAD FINDINGS Anterior circulation: Atherosclerotic changes are present in the cavernous internal carotid arteries bilaterally. There is no significant stenosis relative to the more distal vessels. The ICA termini are within normal limits bilaterally. The A1 and M1 segments are normal. MCA bifurcations are intact. ACA and MCA branch vessels are normal. Posterior circulation: Right vertebral artery is the dominant vessel. There is mild narrowing at the dural margin of the right vertebral artery without a significant stenosis relative to the more distal vessel. Vertebrobasilar junction is normal. There is mild narrowing of proximal basilar artery as well. The right posterior cerebral artery originates from the basilar tip. The left posterior cerebral artery is of fetal type. There is some irregularity of distal PCA branch vessels without a significant proximal stenosis or occlusion. Venous sinuses: The dural sinuses are patent. Straight sinus deep cerebral veins are intact. Cortical veins are unremarkable. Anatomic variants: Fetal type left posterior cerebral artery. Review of the MIP images confirms the above findings IMPRESSION: 1. 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 There is dense calcification and high-grade stenosis at the origin of the left vertebral artery. 2. It is unclear this impacts the areas of infarct given that the left posterior cerebral artery is of fetal type. 3. Atherosclerotic changes at the proximal left internal carotid artery without significant stenosis could be a source of emboli. 4. Minimal atherosclerotic changes at the aortic arch in within the cavernous  internal carotid arteries otherwise. 5. Asymmetric left-sided facet disease most notable at C3-4 and C4-5. 6.  Emphysema (ICD10-J43.9). Electronically Signed   By: Marin Roberts M.D.   On: 06/27/2019 14:00   Ct Angio Neck W Or Wo Contrast  Result Date: 06/27/2019 CLINICAL DATA:  Stroke, follow-up. Right border zone punctate infarcts. Remote lacunar infarct of the right thalamus. EXAM: CT ANGIOGRAPHY HEAD AND NECK TECHNIQUE: Multidetector CT imaging of the head and neck was performed using the standard protocol during bolus administration of intravenous contrast. Multiplanar CT image reconstructions and MIPs were obtained to evaluate the vascular anatomy. Carotid stenosis measurements (when applicable) are obtained utilizing NASCET criteria, using the distal internal carotid diameter as the denominator. CONTRAST:  50mL OMNIPAQUE IOHEXOL 350 MG/ML SOLN COMPARISON:  MR head without contrast 06/26/2019. CT head without contrast 06/25/2019. FINDINGS: CT HEAD FINDINGS Brain: The punctate acute infarcts on MRI are below the resolution of CT. No acute cortical infarcts are evident. Basal ganglia are intact. Scattered white matter changes are again noted. The ventricles are of normal size. No significant extraaxial fluid collection is present. The brainstem and cerebellum are within normal limits. Vascular:  Atherosclerotic calcifications are present within the cavernous internal carotid arteries. There is no hyperdense vessel. Skull: Calvarium is intact. No focal lytic or blastic lesions are present. Sinuses: The paranasal sinuses and mastoid air cells are clear. Orbits: The globes and orbits are within normal limits. Review of the MIP images confirms the above findings CTA NECK FINDINGS Aortic arch: A 4 vessel arch configuration is present. There dense calcifications with a high-grade stenosis at the origin of the left vertebral artery. Minimal calcifications are present at the left subclavian artery without a  significant stenosis. Minimal calcifications are present at the innominate artery without significant stenosis. Right carotid system: Right common carotid artery is within normal limits. Minimal irregularity is present at the bifurcation and proximal right ICA without a significant stenosis. The cervical right ICA is otherwise normal. Left carotid system: The left common carotid artery is within normal limits. Atherosclerotic changes are present at the bifurcation without a significant stenosis. There is a posteromedial plaque in the proximal left ICA without a significant stenosis. There is slight narrowing of the distal left cervical ICA just below the skull base without a significant stenosis. Vertebral arteries: The right vertebral artery is the dominant vessel. It originates from the subclavian artery. There are no tandem stenoses in the left vertebral artery. There is no focal stenosis of the right vertebral artery in the neck. Skeleton: Asymmetric left-sided facet disease is present in the cervical spine, most evident at C3-4 and C4-5 with foraminal narrowing at these levels. Vertebral body heights and alignment are maintained. No focal lytic or blastic lesions are present. Other neck: The soft tissues of the neck are otherwise unremarkable. There is mild heterogeneity of the thyroid without a dominant lesion. Salivary glands are normal. No significant adenopathy is present. Upper chest: Centrilobular emphysematous changes are present. No focal nodule or mass lesion is evident. There is scarring at the right apex. Review of the MIP images confirms the above findings CTA HEAD FINDINGS Anterior circulation: Atherosclerotic changes are present in the cavernous internal carotid arteries bilaterally. There is no significant stenosis relative to the more distal vessels. The ICA termini are within normal limits bilaterally. The A1 and M1 segments are normal. MCA bifurcations are intact. ACA and MCA branch vessels  are normal. Posterior circulation: Right vertebral artery is the dominant vessel. There is mild narrowing at the dural margin of the right vertebral artery without a significant stenosis relative to the more distal vessel. Vertebrobasilar junction is normal. There is mild narrowing of proximal basilar artery as well. The right posterior cerebral artery originates from the basilar tip. The left posterior cerebral artery is of fetal type. There is some irregularity of distal PCA branch vessels without a significant proximal stenosis or occlusion. Venous sinuses: The dural sinuses are patent. Straight sinus deep cerebral veins are intact. Cortical veins are unremarkable. Anatomic variants: Fetal type left posterior cerebral artery. Review of the MIP images confirms the above findings IMPRESSION: 1. 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 There is dense calcification and high-grade stenosis at the origin of the left vertebral artery. 2. It is unclear this impacts the areas of infarct given that the left posterior cerebral artery is of fetal type. 3. Atherosclerotic changes at the proximal left internal carotid artery without significant stenosis could be a source of emboli. 4. Minimal atherosclerotic changes at the aortic arch in within the cavernous internal carotid arteries otherwise. 5. Asymmetric left-sided facet disease most notable at C3-4 and C4-5. 6.  Emphysema (  ICD10-J43.9). Electronically Signed   By: Marin Robertshristopher  Mattern M.D.   On: 06/27/2019 14:00   Mr Brain Wo Contrast  Result Date: 06/26/2019 CLINICAL DATA:  Episodes of slurred speech and word-finding difficulty. Focal neuro deficits of greater than 6 hours. EXAM: MRI HEAD WITHOUT CONTRAST TECHNIQUE: Multiplanar, multiecho pulse sequences of the brain and surrounding structures were obtained without intravenous contrast. COMPARISON:  CT head without contrast 06/25/2019 FINDINGS: Brain: Diffusion-weighted images demonstrate 3 separate  punctate cortical areas of restricted diffusion within a posterior border zone distribution. Other acute infarcts are present. No acute hemorrhage or mass lesion is present. Periventricular and subcortical T2 hyperintensities are mildly advanced for age. Prominent subcortical focus is present in the anterior left frontal lobe. There is a remote lacunar infarct involving the right thalamus. The internal auditory canals are within normal limits. The brainstem and cerebellum are within normal limits. Vascular: Flow is present in the major intracranial arteries. Skull and upper cervical spine: The craniocervical junction is normal. Upper cervical spine is within normal limits. Marrow signal is unremarkable. Sinuses/Orbits: The paranasal sinuses and mastoid air cells are clear. The globes and orbits are within normal limits. IMPRESSION: 1. At least 3 punctate foci of acute nonhemorrhagic infarct in a posterior border zone distribution. Question posterior circulation the disease in the neck. 2. Remote lacunar infarct of the right thalamus. 3. Scattered white matter disease is mildly advanced for age. The finding is nonspecific but can be seen in the setting of chronic microvascular ischemia, a demyelinating process such as multiple sclerosis, vasculitis, complicated migraine headaches, or as the sequelae of a prior infectious or inflammatory process. Electronically Signed   By: Marin Robertshristopher  Mattern M.D.   On: 06/26/2019 15:27   Vas [place] Lower Extremity Venous (dvt)  Result Date: 06/28/2019  Lower Venous Study Indications: Stroke.  Performing Technologist: Jeb LeveringJill Parker RDMS, RVT  Examination Guidelines: A complete evaluation includes B-mode imaging, spectral Doppler, color Doppler, and power Doppler as needed of all accessible portions of each vessel. Bilateral testing is considered an integral part of a complete examination. Limited examinations for reoccurring indications may be performed as noted.   +---------+---------------+---------+-----------+----------+--------------+  RIGHT     Compressibility Phasicity Spontaneity Properties Thrombus Aging  +---------+---------------+---------+-----------+----------+--------------+  CFV       Full            Yes       Yes                                    +---------+---------------+---------+-----------+----------+--------------+  SFJ       Full                                                             +---------+---------------+---------+-----------+----------+--------------+  FV Prox   Full                                                             +---------+---------------+---------+-----------+----------+--------------+  FV Mid    Full                                                             +---------+---------------+---------+-----------+----------+--------------+  FV Distal Full                                                             +---------+---------------+---------+-----------+----------+--------------+  PFV       Full                                                             +---------+---------------+---------+-----------+----------+--------------+  POP       Full            Yes       Yes                                    +---------+---------------+---------+-----------+----------+--------------+  PTV       Full                                                             +---------+---------------+---------+-----------+----------+--------------+  PERO      Full                                                             +---------+---------------+---------+-----------+----------+--------------+   +---------+---------------+---------+-----------+----------+--------------+  LEFT      Compressibility Phasicity Spontaneity Properties Thrombus Aging  +---------+---------------+---------+-----------+----------+--------------+  CFV       Full            Yes       Yes                                     +---------+---------------+---------+-----------+----------+--------------+  SFJ       Full                                                             +---------+---------------+---------+-----------+----------+--------------+  FV Prox   Full                                                             +---------+---------------+---------+-----------+----------+--------------+  FV Mid    Full                                                             +---------+---------------+---------+-----------+----------+--------------+  FV Distal Full                                                             +---------+---------------+---------+-----------+----------+--------------+  PFV       Full                                                             +---------+---------------+---------+-----------+----------+--------------+  POP       Full            Yes       Yes                                    +---------+---------------+---------+-----------+----------+--------------+  PTV       Full                                                             +---------+---------------+---------+-----------+----------+--------------+  PERO      Full                                                             +---------+---------------+---------+-----------+----------+--------------+     Summary: Right: There is no evidence of deep vein thrombosis in the lower extremity. No cystic structure found in the popliteal fossa. Left: There is no evidence of deep vein thrombosis in the lower extremity. No cystic structure found in the popliteal fossa.  *See table(s) above for measurements and observations.    Preliminary         Scheduled Meds:  [START ON 06/29/2019] aspirin  81 mg Oral Pre-Cath   [MAR Hold] aspirin EC  81 mg Oral Daily   [MAR Hold] bisoprolol  2.5 mg Oral Daily   [MAR Hold] famotidine  20 mg Oral Daily   [MAR Hold] furosemide  40 mg Oral Daily   [MAR Hold] levothyroxine  125 mcg Oral Q0600   [MAR  Hold] nicotine  21 mg Transdermal Daily   [MAR Hold] nitroGLYCERIN  0.4 mg Transdermal Daily   [MAR Hold] potassium chloride  20 mEq Oral Daily   [MAR Hold] predniSONE  40 mg Oral Q breakfast   [MAR Hold] rosuvastatin  20 mg Oral Daily   [MAR Hold] sodium chloride flush  3 mL Intravenous Q12H   [MAR Hold] sodium chloride flush  3 mL Intravenous Q12H   [MAR Hold] spironolactone  12.5 mg Oral Daily   Continuous Infusions:  [MAR Hold] sodium chloride 250 mL (06/26/19 0959)   sodium chloride     sodium chloride 10 mL/hr at 06/28/19 1034   heparin Stopped (06/28/19 1214)     LOS: 3 days    Time spent:     Zannie Cove, MD [facility]  06/28/2019, 12:33 PM

## 2019-06-28 NOTE — Progress Notes (Signed)
SLP Cancellation Note  Patient Details Name: Annette Morrison MRN: [id number] DOB: 11-Mar-1956   Cancelled treatment:       Reason Eval/Treat Not Completed: Other (comment) Orders received for swallow evaluation and speech/language evaluation. Pt is NPO this morning pending procedure but has passed the [facility] swallow screen and RN noted no concerns during med pass. SLP to defer swallow evaluation at this time but will f/u for speech/language evaluation as able.   Annette Morrison 06/28/2019, 11:27 AM   Annette Morrison, M.A. [place] [facility] Environmental education officer [phone] Office [phone]

## 2019-06-28 NOTE — H&P (View-Only) (Signed)
° °Progress Note ° °Patient Name: Annette Morrison °Date of Encounter: 06/28/2019 ° °Primary Cardiologist: Samuel McDowell, MD  ° °Subjective  ° °Complains of chest tightness. Breathing litter improved since admit. Report word finding improving. "head is getting better". ° °According to her report, symptoms are really intermittent, it is hard to tell because of her verbal issues whether or not her chest discomfort is new now than it was yesterday.  However it is persistent. ° °She says her breathing improves with coughing ° °Inpatient Medications  °  °Scheduled Meds: °• aspirin EC  81 mg Oral Daily  °• atorvastatin  20 mg Oral Daily  °• bisoprolol  2.5 mg Oral Daily  °• famotidine  20 mg Oral Daily  °• furosemide  40 mg Oral Daily  °• levothyroxine  88 mcg Oral Daily  °• nicotine  21 mg Transdermal Daily  °• nitroGLYCERIN  0.4 mg Transdermal Daily  °• potassium chloride  20 mEq Oral Daily  °• predniSONE  40 mg Oral Q breakfast  °• sodium chloride flush  3 mL Intravenous Q12H  °• spironolactone  12.5 mg Oral Daily  ° °Continuous Infusions: °• sodium chloride 250 mL (06/26/19 0959)  °• heparin 1,000 Units/hr (06/28/19 0603)  ° °PRN Meds: °sodium chloride, acetaminophen, HYDROcodone-acetaminophen, ipratropium-albuterol, ondansetron (ZOFRAN) IV, sodium chloride flush  ° °Vital Signs  °  °Vitals:  ° 06/27/19 2137 06/28/19 0204 06/28/19 0600 06/28/19 0608  °BP:  106/68 115/75   °Pulse: 86 78 74   °Resp: 15 19 20   °Temp: 97.7 °F (36.5 °C)   97.6 °F (36.4 °C)  °TempSrc: Oral   Oral  °SpO2: 97% 96% 98%   °Weight:   48.7 kg   °Height:      ° ° °Intake/Output Summary (Last 24 hours) at 06/28/2019 0803 °Last data filed at 06/28/2019 0630 °Gross per 24 hour  °Intake 1014.95 ml  °Output 2150 ml  °Net -1135.05 ml  ° °Last 3 Weights 06/28/2019 06/27/2019 06/26/2019  °Weight (lbs) 107 lb 6.4 oz 107 lb 5.8 oz 118 lb 13.3 oz  °Weight (kg) 48.716 kg 48.7 kg 53.9 kg  °   ° °Telemetry  °  °SR at rate of 80s - Personally Reviewed ° °ECG  °    °EKG reviewed today is grossly abnormal compared to prior: Sinus rhythm with hyperacute T waves with subtle ST elevations in V3 through V6 with T wave inversions in aVL, V1 and V2.  The hyperacute T wave changes with subtle ST elevation do have appearance of repolarization, but are acutely different compared to 2 days ago. ° ° °Physical Exam  ° °GEN: thin frail female in no acute distress.   °Neck: No JVD °Cardiac: RRR, no murmurs, rubs, or gallops.  °Respiratory: Clear to auscultation bilaterally. °GI: Soft, nontender, non-distended  °MS: No edema; No deformity. °Neuro:  Nonfocal -definitely has dysphasia with difficulty with word finding °Psych: Normal affect  ° °Labs  °  °High Sensitivity Troponin:   °Recent Labs  °Lab 06/25/19 °0803 06/25/19 °1031 06/25/19 °2002 06/25/19 °2234  °TROPONINIHS 371* 337* 325* 283*  °   ° °Chemistry °Recent Labs  °Lab 06/25/19 °0803 06/26/19 °0225 06/27/19 °0348 06/28/19 °0353  °NA 143 139 139 138  °K 3.5 4.4 4.5 4.8  °CL 107 102 100 102  °CO2 22 23 24 23  °GLUCOSE 135* 125* 130* 122*  °BUN 12 12 24* 31*  °CREATININE 0.79 0.78 0.83 0.93  °CALCIUM 9.2 9.5 9.8 10.0  °PROT 7.4  --   --   --   °  ALBUMIN 3.0*  --   --   --   °AST 17  --   --   --   °ALT <5  --   --   --   °ALKPHOS 114  --   --   --   °BILITOT 0.1*  --   --   --   °GFRNONAA >60 >60 >60 >60  °GFRAA >60 >60 >60 >60  °ANIONGAP 14 14 15 13  °  ° °Hematology °Recent Labs  °Lab 06/26/19 °0608 06/27/19 °0348 06/28/19 °0353  °WBC 12.1* 14.3* 14.9*  °RBC 4.13 4.18 4.29  °HGB 12.2 12.2 12.5  °HCT 36.8 38.2 39.4  °MCV 89.1 91.4 91.8  °MCH 29.5 29.2 29.1  °MCHC 33.2 31.9 31.7  °RDW 14.4 14.7 14.7  °PLT 385 418* 366  ° ° °BNP °Recent Labs  °Lab 06/25/19 °0803  °BNP 649.0*  °  ° °Radiology  °  °Ct Angio Head W Or Wo Contrast ° °Result Date: 06/27/2019 °CLINICAL DATA:  Stroke, follow-up. Right border zone punctate infarcts. Remote lacunar infarct of the right thalamus. EXAM: CT ANGIOGRAPHY HEAD AND NECK TECHNIQUE: Multidetector CT imaging  of the head and neck was performed using the standard protocol during bolus administration of intravenous contrast. Multiplanar CT image reconstructions and MIPs were obtained to evaluate the vascular anatomy. Carotid stenosis measurements (when applicable) are obtained utilizing NASCET criteria, using the distal internal carotid diameter as the denominator. CONTRAST:  50mL OMNIPAQUE IOHEXOL 350 MG/ML SOLN COMPARISON:  MR head without contrast 06/26/2019. CT head without contrast 06/25/2019. FINDINGS: CT HEAD FINDINGS Brain: The punctate acute infarcts on MRI are below the resolution of CT. No acute cortical infarcts are evident. Basal ganglia are intact. Scattered white matter changes are again noted. The ventricles are of normal size. No significant extraaxial fluid collection is present. The brainstem and cerebellum are within normal limits. Vascular: Atherosclerotic calcifications are present within the cavernous internal carotid arteries. There is no hyperdense vessel. Skull: Calvarium is intact. No focal lytic or blastic lesions are present. Sinuses: The paranasal sinuses and mastoid air cells are clear. Orbits: The globes and orbits are within normal limits. Review of the MIP images confirms the above findings CTA NECK FINDINGS Aortic arch: A 4 vessel arch configuration is present. There dense calcifications with a high-grade stenosis at the origin of the left vertebral artery. Minimal calcifications are present at the left subclavian artery without a significant stenosis. Minimal calcifications are present at the innominate artery without significant stenosis. Right carotid system: Right common carotid artery is within normal limits. Minimal irregularity is present at the bifurcation and proximal right ICA without a significant stenosis. The cervical right ICA is otherwise normal. Left carotid system: The left common carotid artery is within normal limits. Atherosclerotic changes are present at the  bifurcation without a significant stenosis. There is a posteromedial plaque in the proximal left ICA without a significant stenosis. There is slight narrowing of the distal left cervical ICA just below the skull base without a significant stenosis. Vertebral arteries: The right vertebral artery is the dominant vessel. It originates from the subclavian artery. There are no tandem stenoses in the left vertebral artery. There is no focal stenosis of the right vertebral artery in the neck. Skeleton: Asymmetric left-sided facet disease is present in the cervical spine, most evident at C3-4 and C4-5 with foraminal narrowing at these levels. Vertebral body heights and alignment are maintained. No focal lytic or blastic lesions are present. Other neck: The soft tissues   of the neck are otherwise unremarkable. There is mild heterogeneity of the thyroid without a dominant lesion. Salivary glands are normal. No significant adenopathy is present. Upper chest: Centrilobular emphysematous changes are present. No focal nodule or mass lesion is evident. There is scarring at the right apex. Review of the MIP images confirms the above findings CTA HEAD FINDINGS Anterior circulation: Atherosclerotic changes are present in the cavernous internal carotid arteries bilaterally. There is no significant stenosis relative to the more distal vessels. The ICA termini are within normal limits bilaterally. The A1 and M1 segments are normal. MCA bifurcations are intact. ACA and MCA branch vessels are normal. Posterior circulation: Right vertebral artery is the dominant vessel. There is mild narrowing at the dural margin of the right vertebral artery without a significant stenosis relative to the more distal vessel. Vertebrobasilar junction is normal. There is mild narrowing of proximal basilar artery as well. The right posterior cerebral artery originates from the basilar tip. The left posterior cerebral artery is of fetal type. There is some  irregularity of distal PCA branch vessels without a significant proximal stenosis or occlusion. Venous sinuses: The dural sinuses are patent. Straight sinus deep cerebral veins are intact. Cortical veins are unremarkable. Anatomic variants: Fetal type left posterior cerebral artery. Review of the MIP images confirms the above findings IMPRESSION: 1. 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 There is dense calcification and high-grade stenosis at the origin of the left vertebral artery. 2. It is unclear this impacts the areas of infarct given that the left posterior cerebral artery is of fetal type. 3. Atherosclerotic changes at the proximal left internal carotid artery without significant stenosis could be a source of emboli. 4. Minimal atherosclerotic changes at the aortic arch in within the cavernous internal carotid arteries otherwise. 5. Asymmetric left-sided facet disease most notable at C3-4 and C4-5. 6.  Emphysema (ICD10-J43.9). Electronically Signed   By: Christopher  Mattern M.D.   On: 06/27/2019 14:00  ° °Ct Angio Neck W Or Wo Contrast ° °Result Date: 06/27/2019 °CLINICAL DATA:  Stroke, follow-up. Right border zone punctate infarcts. Remote lacunar infarct of the right thalamus. EXAM: CT ANGIOGRAPHY HEAD AND NECK TECHNIQUE: Multidetector CT imaging of the head and neck was performed using the standard protocol during bolus administration of intravenous contrast. Multiplanar CT image reconstructions and MIPs were obtained to evaluate the vascular anatomy. Carotid stenosis measurements (when applicable) are obtained utilizing NASCET criteria, using the distal internal carotid diameter as the denominator. CONTRAST:  50mL OMNIPAQUE IOHEXOL 350 MG/ML SOLN COMPARISON:  MR head without contrast 06/26/2019. CT head without contrast 06/25/2019. FINDINGS: CT HEAD FINDINGS Brain: The punctate acute infarcts on MRI are below the resolution of CT. No acute cortical infarcts are evident. Basal ganglia are  intact. Scattered white matter changes are again noted. The ventricles are of normal size. No significant extraaxial fluid collection is present. The brainstem and cerebellum are within normal limits. Vascular: Atherosclerotic calcifications are present within the cavernous internal carotid arteries. There is no hyperdense vessel. Skull: Calvarium is intact. No focal lytic or blastic lesions are present. Sinuses: The paranasal sinuses and mastoid air cells are clear. Orbits: The globes and orbits are within normal limits. Review of the MIP images confirms the above findings CTA NECK FINDINGS Aortic arch: A 4 vessel arch configuration is present. There dense calcifications with a high-grade stenosis at the origin of the left vertebral artery. Minimal calcifications are present at the left subclavian artery without a significant stenosis. Minimal calcifications   are present at the innominate artery without significant stenosis. Right carotid system: Right common carotid artery is within normal limits. Minimal irregularity is present at the bifurcation and proximal right ICA without a significant stenosis. The cervical right ICA is otherwise normal. Left carotid system: The left common carotid artery is within normal limits. Atherosclerotic changes are present at the bifurcation without a significant stenosis. There is a posteromedial plaque in the proximal left ICA without a significant stenosis. There is slight narrowing of the distal left cervical ICA just below the skull base without a significant stenosis. Vertebral arteries: The right vertebral artery is the dominant vessel. It originates from the subclavian artery. There are no tandem stenoses in the left vertebral artery. There is no focal stenosis of the right vertebral artery in the neck. Skeleton: Asymmetric left-sided facet disease is present in the cervical spine, most evident at C3-4 and C4-5 with foraminal narrowing at these levels. Vertebral body heights  and alignment are maintained. No focal lytic or blastic lesions are present. Other neck: The soft tissues of the neck are otherwise unremarkable. There is mild heterogeneity of the thyroid without a dominant lesion. Salivary glands are normal. No significant adenopathy is present. Upper chest: Centrilobular emphysematous changes are present. No focal nodule or mass lesion is evident. There is scarring at the right apex. Review of the MIP images confirms the above findings CTA HEAD FINDINGS Anterior circulation: Atherosclerotic changes are present in the cavernous internal carotid arteries bilaterally. There is no significant stenosis relative to the more distal vessels. The ICA termini are within normal limits bilaterally. The A1 and M1 segments are normal. MCA bifurcations are intact. ACA and MCA branch vessels are normal. Posterior circulation: Right vertebral artery is the dominant vessel. There is mild narrowing at the dural margin of the right vertebral artery without a significant stenosis relative to the more distal vessel. Vertebrobasilar junction is normal. There is mild narrowing of proximal basilar artery as well. The right posterior cerebral artery originates from the basilar tip. The left posterior cerebral artery is of fetal type. There is some irregularity of distal PCA branch vessels without a significant proximal stenosis or occlusion. Venous sinuses: The dural sinuses are patent. Straight sinus deep cerebral veins are intact. Cortical veins are unremarkable. Anatomic variants: Fetal type left posterior cerebral artery. Review of the MIP images confirms the above findings IMPRESSION: 1. 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 There is dense calcification and high-grade stenosis at the origin of the left vertebral artery. 2. It is unclear this impacts the areas of infarct given that the left posterior cerebral artery is of fetal type. 3. Atherosclerotic changes at the proximal left  internal carotid artery without significant stenosis could be a source of emboli. 4. Minimal atherosclerotic changes at the aortic arch in within the cavernous internal carotid arteries otherwise. 5. Asymmetric left-sided facet disease most notable at C3-4 and C4-5. 6.  Emphysema (ICD10-J43.9). Electronically Signed   By: Christopher  Mattern M.D.   On: 06/27/2019 14:00  ° °Mr Brain Wo Contrast ° °Result Date: 06/26/2019 °CLINICAL DATA:  Episodes of slurred speech and word-finding difficulty. Focal neuro deficits of greater than 6 hours. EXAM: MRI HEAD WITHOUT CONTRAST TECHNIQUE: Multiplanar, multiecho pulse sequences of the brain and surrounding structures were obtained without intravenous contrast. COMPARISON:  CT head without contrast 06/25/2019 FINDINGS: Brain: Diffusion-weighted images demonstrate 3 separate punctate cortical areas of restricted diffusion within a posterior border zone distribution. Other acute infarcts are present. No acute   hemorrhage or mass lesion is present. Periventricular and subcortical T2 hyperintensities are mildly advanced for age. Prominent subcortical focus is present in the anterior left frontal lobe. There is a remote lacunar infarct involving the right thalamus. The internal auditory canals are within normal limits. The brainstem and cerebellum are within normal limits. Vascular: Flow is present in the major intracranial arteries. Skull and upper cervical spine: The craniocervical junction is normal. Upper cervical spine is within normal limits. Marrow signal is unremarkable. Sinuses/Orbits: The paranasal sinuses and mastoid air cells are clear. The globes and orbits are within normal limits. IMPRESSION: 1. At least 3 punctate foci of acute nonhemorrhagic infarct in a posterior border zone distribution. Question posterior circulation the disease in the neck. 2. Remote lacunar infarct of the right thalamus. 3. Scattered white matter disease is mildly advanced for age. The finding  is nonspecific but can be seen in the setting of chronic microvascular ischemia, a demyelinating process such as multiple sclerosis, vasculitis, complicated migraine headaches, or as the sequelae of a prior infectious or inflammatory process. Electronically Signed   By: Christopher  Mattern M.D.   On: 06/26/2019 15:27  ° ° °Cardiac Studies  ° °Echo 06/25/2019 °IMPRESSIONS °  ° 1. The left ventricle has mild-moderately reduced systolic function, with an ejection fraction of 40-45%. The cavity size was mildly dilated. Left ventricular diastolic Doppler parameters are consistent with impaired relaxation. ° 2. There is akinesis of the left ventricular, mid septal wall. ° 3. There iw akinesis of the left ventricular, basal-mid inferolateral wall. ° 4. The right ventricle has normal systolic function. The cavity was normal. There is no increase in right ventricular wall thickness. Right ventricular systolic pressure is normal with an estimated pressure of 22.7 mmHg. ° 5. The aortic valve is tricuspid. Mild calcification of the aortic valve. Mild aortic annular calcification noted. ° 6. The mitral valve is grossly normal. There is mild mitral annular calcification present. ° 7. The tricuspid valve is grossly normal. ° 8. The aorta is normal unless otherwise noted. ° 9. Evidence of atrial level shunting detected by color flow Doppler. ° °Patient Profile  °   °63 y.o. female with hx of hypertension, HLD, hypothyroidism and long standing tobacco smoking admitted with COPD exacerbation. She was found to have newly documented cardiomyopathy with wall motion abnormality suggesting ischemic etiology. Hx of difficulties with speech and word finding problem>> work up reveled subacute stroke.  ° °Assessment & Plan  °  °1. Acute systolic and diastolic CHF °- Echo showed LVEF of 40-45%, mild DD ad wall motion abnormality. Suspect ischemic etiology. Per neugology "this should not prevent her from getting a heart cath. °Embolic nature of  her strokes do raise suspicion for cardioembolic source, although atherosclerotic disease is also likely". °- BNP 649 on admit. Diuresed 3.4L so far. Weight down 13 lb (120>>>107lb) °- She appears euvolemic °- Continue Bisoprolol 2.5mg qd, Lasix 40mg qd (converted to oral) and Spironolactone 12.5mg qd °- Consider ACE/ARB at discharge depending on blood pressure.  Currently pressures would not tolerate ° °2. Chest pain and elevated troponin °- on admit patient reports improved/resolved CP after SL nitro °- Hs-Trop trend was low °371* 337* 325* 283*  °- She is treated with heparin since admit °- Today  She complains of chest tightness >> will start nitro patch and EKG °- Will review further plan with Dr. Taleya Whitcher ° °3. Acute/Subacute stroke °- Reports 3 weeks hx of difficulties with speech and word finding problem °- Per Neurolgoy -current   neurologic symptoms should not preclude cardiac catheterization °- Okay to change cardiac medications if permissive hypertension needed °- Currently BP soft low ° °4. HTN °- soft BP °- AS above °-Cozaar on hold ° °5. Tobacco smoking °- Encouraged cessation  ° °6. HLD °- 07/29/2018: Cholesterol 349; HDL 48; LDL Cholesterol UNABLE TO CALCULATE IF TRIGLYCERIDE OVER 400 mg/dL; Triglycerides 720; VLDL UNABLE TO CALCULATE IF TRIGLYCERIDE OVER 400 mg/dL  °- Will repeat lipid panel °-Increase Lipitor to 80 mg mg for now ° ° ° °For questions or updates, please contact [facility] °Please consult [email or web address] for contact info under  ° °  °   °Signed, °Bhavinkumar Bhagat, PA  °06/28/2019, 8:03 AM   ° ° °ATTENDING ATTESTATION ° °I have seen, examined and evaluated the patient this AM along with Mr. Bhagat, PA-C.  After reviewing all the available data and chart, we discussed the patients laboratory, study & physical findings as well as symptoms in detail. I agree with his findings, examination as well as impression recommendations as per our discussion.   ° °Attending adjustments noted in  italics.  °I am quite concerned about Ms. Broaddus's change in EKGs.  We do have confirmation from neurology that they felt like her neuro symptoms have been ongoing for several weeks now suspect subacute event.  Although they said this did not explain her verbal difficulties. ° °With these EKG changes, however worried about potential worsening coronary artery disease and I think it is probably prudent for us to consider cardiac catheterization.  I discussed with Dr. End, after reviewing EKG with him and determined that there is definite concern for potential progression of CAD.  EKG does not meet STEMI criteria, however does show signs concerning for possible early stage anterolateral STEMI. ° °As such, we have chosen to proceed with cardiac catheterization at the next scheduled slot (all 3 labs have patients on the table, she will be next for Dr. End.  I feel this is relatively safe given the fact that she really has stable discomfort at this time. ° ° ° °Aurilla Coulibaly, M.D., M.S. °Interventional Cardiologist  ° °Pager # [phone] °Phone # [phone] °[address] °[place], [place] [address] ° ° °

## 2019-06-28 NOTE — Progress Notes (Signed)
STROKE TEAM PROGRESS NOTE   INTERVAL HISTORY No family is at the bedside.  Pt awake alert, orientated, no focal neuro deficit. On heparin IV. Cardiology on board for ACS management. Plan for cardiac cath.   Vitals:   06/28/19 0600 06/28/19 0608 06/28/19 0907 06/28/19 0926  BP: 115/75  116/84 108/81  Pulse: 74  74   Resp: 20   12  Temp:  97.6 F (36.4 C) 97.6 F (36.4 C)   TempSrc:  Oral Oral   SpO2: 98%  99%   Weight: 48.7 kg     Height:        CBC:  Recent Labs  Lab 06/25/19 0803  06/27/19 0348 06/28/19 0353  WBC 16.2*   < > 14.3* 14.9*  NEUTROABS 13.7*  --   --   --   HGB 12.3   < > 12.2 12.5  HCT 39.4   < > 38.2 39.4  MCV 93.4   < > 91.4 91.8  PLT 400   < > 418* 366   < > = values in this interval not displayed.    Basic Metabolic Panel:  Recent Labs  Lab 06/27/19 0348 06/28/19 0353  NA 139 138  K 4.5 4.8  CL 100 102  CO2 24 23  GLUCOSE 130* 122*  BUN 24* 31*  CREATININE 0.83 0.93  CALCIUM 9.8 10.0   Lipid Panel:     Component Value Date/Time   CHOL 163 06/28/2019 0353   TRIG 334 (H) 06/28/2019 0353   HDL 58 06/28/2019 0353   CHOLHDL 2.8 06/28/2019 0353   VLDL 67 (H) 06/28/2019 0353   LDLCALC 38 06/28/2019 0353   HgbA1c:  Lab Results  Component Value Date   HGBA1C 5.4 06/28/2019   Urine Drug Screen: No results found for: LABOPIA, COCAINSCRNUR, LABBENZ, AMPHETMU, THCU, LABBARB  Alcohol Level No results found for: ETH  IMAGING Ct Angio Head W Or Wo Contrast  Result Date: 06/27/2019 CLINICAL DATA:  Stroke, follow-up. Right border zone punctate infarcts. Remote lacunar infarct of the right thalamus. EXAM: CT ANGIOGRAPHY HEAD AND NECK TECHNIQUE: Multidetector CT imaging of the head and neck was performed using the standard protocol during bolus administration of intravenous contrast. Multiplanar CT image reconstructions and MIPs were obtained to evaluate the vascular anatomy. Carotid stenosis measurements (when applicable) are obtained utilizing  NASCET criteria, using the distal internal carotid diameter as the denominator. CONTRAST:  48mL OMNIPAQUE IOHEXOL 350 MG/ML SOLN COMPARISON:  MR head without contrast 06/26/2019. CT head without contrast 06/25/2019. FINDINGS: CT HEAD FINDINGS Brain: The punctate acute infarcts on MRI are below the resolution of CT. No acute cortical infarcts are evident. Basal ganglia are intact. Scattered white matter changes are again noted. The ventricles are of normal size. No significant extraaxial fluid collection is present. The brainstem and cerebellum are within normal limits. Vascular: Atherosclerotic calcifications are present within the cavernous internal carotid arteries. There is no hyperdense vessel. Skull: Calvarium is intact. No focal lytic or blastic lesions are present. Sinuses: The paranasal sinuses and mastoid air cells are clear. Orbits: The globes and orbits are within normal limits. Review of the MIP images confirms the above findings CTA NECK FINDINGS Aortic arch: A 4 vessel arch configuration is present. There dense calcifications with a high-grade stenosis at the origin of the left vertebral artery. Minimal calcifications are present at the left subclavian artery without a significant stenosis. Minimal calcifications are present at the innominate artery without significant stenosis. Right carotid system: Right common carotid  artery is within normal limits. Minimal irregularity is present at the bifurcation and proximal right ICA without a significant stenosis. The cervical right ICA is otherwise normal. Left carotid system: The left common carotid artery is within normal limits. Atherosclerotic changes are present at the bifurcation without a significant stenosis. There is a posteromedial plaque in the proximal left ICA without a significant stenosis. There is slight narrowing of the distal left cervical ICA just below the skull base without a significant stenosis. Vertebral arteries: The right vertebral  artery is the dominant vessel. It originates from the subclavian artery. There are no tandem stenoses in the left vertebral artery. There is no focal stenosis of the right vertebral artery in the neck. Skeleton: Asymmetric left-sided facet disease is present in the cervical spine, most evident at C3-4 and C4-5 with foraminal narrowing at these levels. Vertebral body heights and alignment are maintained. No focal lytic or blastic lesions are present. Other neck: The soft tissues of the neck are otherwise unremarkable. There is mild heterogeneity of the thyroid without a dominant lesion. Salivary glands are normal. No significant adenopathy is present. Upper chest: Centrilobular emphysematous changes are present. No focal nodule or mass lesion is evident. There is scarring at the right apex. Review of the MIP images confirms the above findings CTA HEAD FINDINGS Anterior circulation: Atherosclerotic changes are present in the cavernous internal carotid arteries bilaterally. There is no significant stenosis relative to the more distal vessels. The ICA termini are within normal limits bilaterally. The A1 and M1 segments are normal. MCA bifurcations are intact. ACA and MCA branch vessels are normal. Posterior circulation: Right vertebral artery is the dominant vessel. There is mild narrowing at the dural margin of the right vertebral artery without a significant stenosis relative to the more distal vessel. Vertebrobasilar junction is normal. There is mild narrowing of proximal basilar artery as well. The right posterior cerebral artery originates from the basilar tip. The left posterior cerebral artery is of fetal type. There is some irregularity of distal PCA branch vessels without a significant proximal stenosis or occlusion. Venous sinuses: The dural sinuses are patent. Straight sinus deep cerebral veins are intact. Cortical veins are unremarkable. Anatomic variants: Fetal type left posterior cerebral artery. Review of  the MIP images confirms the above findings IMPRESSION: 1. 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 There is dense calcification and high-grade stenosis at the origin of the left vertebral artery. 2. It is unclear this impacts the areas of infarct given that the left posterior cerebral artery is of fetal type. 3. Atherosclerotic changes at the proximal left internal carotid artery without significant stenosis could be a source of emboli. 4. Minimal atherosclerotic changes at the aortic arch in within the cavernous internal carotid arteries otherwise. 5. Asymmetric left-sided facet disease most notable at C3-4 and C4-5. 6.  Emphysema (ICD10-J43.9). Electronically Signed   By: Marin Roberts M.D.   On: 06/27/2019 14:00   Ct Angio Neck W Or Wo Contrast  Result Date: 06/27/2019 CLINICAL DATA:  Stroke, follow-up. Right border zone punctate infarcts. Remote lacunar infarct of the right thalamus. EXAM: CT ANGIOGRAPHY HEAD AND NECK TECHNIQUE: Multidetector CT imaging of the head and neck was performed using the standard protocol during bolus administration of intravenous contrast. Multiplanar CT image reconstructions and MIPs were obtained to evaluate the vascular anatomy. Carotid stenosis measurements (when applicable) are obtained utilizing NASCET criteria, using the distal internal carotid diameter as the denominator. CONTRAST:  50mL OMNIPAQUE IOHEXOL 350 MG/ML SOLN  COMPARISON:  MR head without contrast 06/26/2019. CT head without contrast 06/25/2019. FINDINGS: CT HEAD FINDINGS Brain: The punctate acute infarcts on MRI are below the resolution of CT. No acute cortical infarcts are evident. Basal ganglia are intact. Scattered white matter changes are again noted. The ventricles are of normal size. No significant extraaxial fluid collection is present. The brainstem and cerebellum are within normal limits. Vascular: Atherosclerotic calcifications are present within the cavernous internal carotid  arteries. There is no hyperdense vessel. Skull: Calvarium is intact. No focal lytic or blastic lesions are present. Sinuses: The paranasal sinuses and mastoid air cells are clear. Orbits: The globes and orbits are within normal limits. Review of the MIP images confirms the above findings CTA NECK FINDINGS Aortic arch: A 4 vessel arch configuration is present. There dense calcifications with a high-grade stenosis at the origin of the left vertebral artery. Minimal calcifications are present at the left subclavian artery without a significant stenosis. Minimal calcifications are present at the innominate artery without significant stenosis. Right carotid system: Right common carotid artery is within normal limits. Minimal irregularity is present at the bifurcation and proximal right ICA without a significant stenosis. The cervical right ICA is otherwise normal. Left carotid system: The left common carotid artery is within normal limits. Atherosclerotic changes are present at the bifurcation without a significant stenosis. There is a posteromedial plaque in the proximal left ICA without a significant stenosis. There is slight narrowing of the distal left cervical ICA just below the skull base without a significant stenosis. Vertebral arteries: The right vertebral artery is the dominant vessel. It originates from the subclavian artery. There are no tandem stenoses in the left vertebral artery. There is no focal stenosis of the right vertebral artery in the neck. Skeleton: Asymmetric left-sided facet disease is present in the cervical spine, most evident at C3-4 and C4-5 with foraminal narrowing at these levels. Vertebral body heights and alignment are maintained. No focal lytic or blastic lesions are present. Other neck: The soft tissues of the neck are otherwise unremarkable. There is mild heterogeneity of the thyroid without a dominant lesion. Salivary glands are normal. No significant adenopathy is present. Upper  chest: Centrilobular emphysematous changes are present. No focal nodule or mass lesion is evident. There is scarring at the right apex. Review of the MIP images confirms the above findings CTA HEAD FINDINGS Anterior circulation: Atherosclerotic changes are present in the cavernous internal carotid arteries bilaterally. There is no significant stenosis relative to the more distal vessels. The ICA termini are within normal limits bilaterally. The A1 and M1 segments are normal. MCA bifurcations are intact. ACA and MCA branch vessels are normal. Posterior circulation: Right vertebral artery is the dominant vessel. There is mild narrowing at the dural margin of the right vertebral artery without a significant stenosis relative to the more distal vessel. Vertebrobasilar junction is normal. There is mild narrowing of proximal basilar artery as well. The right posterior cerebral artery originates from the basilar tip. The left posterior cerebral artery is of fetal type. There is some irregularity of distal PCA branch vessels without a significant proximal stenosis or occlusion. Venous sinuses: The dural sinuses are patent. Straight sinus deep cerebral veins are intact. Cortical veins are unremarkable. Anatomic variants: Fetal type left posterior cerebral artery. Review of the MIP images confirms the above findings IMPRESSION: 1. 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 There is dense calcification and high-grade stenosis at the origin of the left vertebral artery. 2. It  is unclear this impacts the areas of infarct given that the left posterior cerebral artery is of fetal type. 3. Atherosclerotic changes at the proximal left internal carotid artery without significant stenosis could be a source of emboli. 4. Minimal atherosclerotic changes at the aortic arch in within the cavernous internal carotid arteries otherwise. 5. Asymmetric left-sided facet disease most notable at C3-4 and C4-5. 6.  Emphysema  (ICD10-J43.9). Electronically Signed   By: Marin Robertshristopher  Mattern M.D.   On: 06/27/2019 14:00   Mr Brain Wo Contrast  Result Date: 06/26/2019 CLINICAL DATA:  Episodes of slurred speech and word-finding difficulty. Focal neuro deficits of greater than 6 hours. EXAM: MRI HEAD WITHOUT CONTRAST TECHNIQUE: Multiplanar, multiecho pulse sequences of the brain and surrounding structures were obtained without intravenous contrast. COMPARISON:  CT head without contrast 06/25/2019 FINDINGS: Brain: Diffusion-weighted images demonstrate 3 separate punctate cortical areas of restricted diffusion within a posterior border zone distribution. Other acute infarcts are present. No acute hemorrhage or mass lesion is present. Periventricular and subcortical T2 hyperintensities are mildly advanced for age. Prominent subcortical focus is present in the anterior left frontal lobe. There is a remote lacunar infarct involving the right thalamus. The internal auditory canals are within normal limits. The brainstem and cerebellum are within normal limits. Vascular: Flow is present in the major intracranial arteries. Skull and upper cervical spine: The craniocervical junction is normal. Upper cervical spine is within normal limits. Marrow signal is unremarkable. Sinuses/Orbits: The paranasal sinuses and mastoid air cells are clear. The globes and orbits are within normal limits. IMPRESSION: 1. At least 3 punctate foci of acute nonhemorrhagic infarct in a posterior border zone distribution. Question posterior circulation the disease in the neck. 2. Remote lacunar infarct of the right thalamus. 3. Scattered white matter disease is mildly advanced for age. The finding is nonspecific but can be seen in the setting of chronic microvascular ischemia, a demyelinating process such as multiple sclerosis, vasculitis, complicated migraine headaches, or as the sequelae of a prior infectious or inflammatory process. Electronically Signed   By: Marin Robertshristopher   Mattern M.D.   On: 06/26/2019 15:27    PHYSICAL EXAM  Temp:  [97.6 F (36.4 C)-97.7 F (36.5 C)] 97.6 F (36.4 C) (08/24 0907) Pulse Rate:  [74-86] 74 (08/24 0907) Resp:  [12-20] 12 (08/24 0926) BP: (96-116)/(68-84) 108/81 (08/24 0926) SpO2:  [96 %-99 %] 99 % (08/24 0907) Weight:  [48.7 kg] 48.7 kg (08/24 0600)  General - Well nourished, well developed, in no apparent distress.  Ophthalmologic - fundi not visualized due to noncooperation.  Cardiovascular - Regular rate and rhythm.  Mental Status -  Level of arousal and orientation to time, place, and person were intact. Language including expression, repetition, comprehension was assessed and found intact. Naming 3/4.  Cranial Nerves II - XII - II - Visual field intact OU. III, IV, VI - Extraocular movements intact. V - Facial sensation intact bilaterally. VII - Facial movement intact bilaterally. VIII - Hearing & vestibular intact bilaterally. X - Palate elevates symmetrically. XI - Chin turning & shoulder shrug intact bilaterally. XII - Tongue protrusion intact.  Motor Strength - The patient's strength was normal in all extremities and pronator drift was absent.  Bulk was normal and fasciculations were absent   Motor Tone - Muscle tone was assessed at the neck and appendages and was normal.  Reflexes - The patient's reflexes were symmetrical in all extremities and she had no pathological reflexes.  Sensory - Light touch, temperature/pinprick were assessed and  were symmetrical.    Coordination - The patient had normal movements in the hands with no ataxia or dysmetria.  Tremor was absent.  Gait and Station - deferred.   ASSESSMENT/PLAN Ms. Annette Morrison is a 63 y.o. female with history of HTN, HLD, tobaccos use, hypothyroidism presenting with SOB, CP and intermittent slurred speeh and difficulty finding words for several weeks.   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 Old R thalamic lacune. Scattered white matter disease.  CTA heaad & neck dense calcification and high-grade stenosis origin L VA. atherosclerosis of proximal L ICA.    LE Doppler  No DVT  2D Echo EF 40-45%. Atrial level shunting. No source of embolus  Recommend 30 day cardiac event monitoring as outpt to rule out afib   LDL 38  HgbA1c 5.4  IV heparin for VTE prophylaxis  No antithrombotic (typically takes aspirin 81 mg daily but [facility]) prior to admission, now on aspirin 81 mg daily and heparin IV. Further antiplatelet regimen per cardiology.   Therapy recommendations:  pending   Disposition:  pending   OK from neuro standpoint for cardiac cath. Avoid low BP perioperatively  Non-STEMI   Chest pain  elevated troponins  for cardiac cath today.   On IV heparin   Further antiplatelet regimen per cardiology  Cardiomyopathy   Likely ischemic due to NSTEMI  Cardiology on board  On lasix, BB, spironolactone   On ASA and heparin IV  Recommend 30 day cardiac event monitoring to rule out afib as outpt  Hypertension  Stable . Permissive hypertension (OK if < 180/105) but gradually normalize in 5-7 days . Long-term BP goal normotensive  Hyperlipidemia  Home meds:  crestor 20, resumed in hospital  LDL 38, goal < 70  Continue statin at discharge  Tobacco abuse  Current smoker  Smoking cessation counseling provided  Nicotine patch provided  Pt is willing to quit  Other Stroke Risk Factors  Hx ETOH use  Other Active Problems  Mild COPD exacerbation  Hypothyroidism  Hospital day # 3  Neurology will sign off. Please call with questions. Pt will [facility] stroke [facility] in about 4 weeks. Thanks for the consult.  Marvel PlanJindong Younique Casad, MD PhD Stroke Neurology 06/28/2019 12:22 PM   To contact Stroke  Continuity provider, please refer to [email or web address]. After hours, contact General Neurology

## 2019-06-28 NOTE — Progress Notes (Signed)
PT Cancellation Note  Patient Details Name: Annette Morrison MRN: [id number] DOB: 1956-08-12   Cancelled Treatment:    Reason Eval/Treat Not Completed: Medical issues which prohibited therapy(pt pending cardiac cath today and per MD hold until after cath)   Sandy Salaam Lakeyta Vandenheuvel 06/28/2019, 9:55 AM  Elwyn Reach, PT Acute Rehabilitation Services Pager: [phone] Office: [phone]

## 2019-06-28 NOTE — Interval H&P Note (Signed)
History and Physical Interval Note:  06/28/2019 12:35 PM  Annette Morrison  has presented today for surgery, with the diagnosis of NSTEMI.  The various methods of treatment have been discussed with the patient and family. After consideration of risks, benefits and other options for treatment, the patient has consented to  Procedure(s): LEFT HEART CATH AND CORONARY ANGIOGRAPHY (N/A) as a surgical intervention.  The patient's history has been reviewed, patient examined, no change in status, stable for surgery.  I have reviewed the patient's chart and labs.  Questions were answered to the patient's satisfaction.    Cath Lab Visit (complete for each Cath Lab visit)  Clinical Evaluation Leading to the Procedure:   ACS: Yes.    Non-ACS:  N/A  [name]

## 2019-06-28 NOTE — Plan of Care (Signed)
  Problem: Clinical Measurements: Goal: Respiratory complications will improve Outcome: Progressing Note: No s/s of respiratory complications noted.  Stable on 2L/[place]. Goal: Cardiovascular complication will be avoided Outcome: Progressing Note: No s/s of cardiovascular complication noted.  VSS.  NSR on telemetry.   Problem: Self-Care: Goal: Ability to communicate needs accurately will improve Outcome: Completed/Met   Problem: Nutrition: Goal: Risk of aspiration will decrease Outcome: Completed/Met

## 2019-06-28 NOTE — Progress Notes (Signed)
LE venous duplex       has been completed. Preliminary results can be found under CV proc through chart review. Lydiana Milley, BS, RDMS, RVT   

## 2019-06-29 ENCOUNTER — Other Ambulatory Visit: Payer: Self-pay | Admitting: Physician Assistant

## 2019-06-29 ENCOUNTER — Encounter (HOSPITAL_COMMUNITY): Payer: Self-pay | Admitting: Internal Medicine

## 2019-06-29 DIAGNOSIS — J9601 Acute respiratory failure with hypoxia: Secondary | ICD-10-CM

## 2019-06-29 DIAGNOSIS — I63019 Cerebral infarction due to thrombosis of unspecified vertebral artery: Secondary | ICD-10-CM

## 2019-06-29 DIAGNOSIS — I25110 Atherosclerotic heart disease of native coronary artery with unstable angina pectoris: Secondary | ICD-10-CM

## 2019-06-29 DIAGNOSIS — I6340 Cerebral infarction due to embolism of unspecified cerebral artery: Secondary | ICD-10-CM

## 2019-06-29 DIAGNOSIS — F17200 Nicotine dependence, unspecified, uncomplicated: Secondary | ICD-10-CM

## 2019-06-29 DIAGNOSIS — I5020 Unspecified systolic (congestive) heart failure: Secondary | ICD-10-CM

## 2019-06-29 LAB — BASIC METABOLIC PANEL
Anion gap: 12 (ref 5–15)
BUN: 31 mg/dL — ABNORMAL HIGH (ref 8–23)
CO2: 24 mmol/L (ref 22–32)
Calcium: 9.8 mg/dL (ref 8.9–10.3)
Chloride: 100 mmol/L (ref 98–111)
Creatinine, Ser: 0.82 mg/dL (ref 0.44–1.00)
GFR calc Af Amer: >60 mL/min (ref >60)
GFR calc non Af Amer: >60 mL/min (ref >60)
Glucose, Bld: 97 mg/dL (ref 70–99)
Potassium: 4.1 mmol/L (ref 3.5–5.1)
Sodium: 136 mmol/L (ref 135–145)

## 2019-06-29 LAB — CBC
HCT: 39.3 % (ref 36.0–46.0)
Hemoglobin: 12.7 g/dL (ref 12.0–15.0)
MCH: 29.3 pg (ref 26.0–34.0)
MCHC: 32.3 g/dL (ref 30.0–36.0)
MCV: 90.6 fL (ref 80.0–100.0)
Platelets: 359 10*3/uL (ref 150–400)
RBC: 4.34 MIL/uL (ref 3.87–5.11)
RDW: 14.6 % (ref 11.5–15.5)
WBC: 15.1 10*3/uL — ABNORMAL HIGH (ref 4.0–10.5)
nRBC: 0 % (ref 0.0–0.2)

## 2019-06-29 MED ORDER — ASPIRIN 81 MG PO TBEC: 81.0000 mg | DELAYED_RELEASE_TABLET | Freq: Every day | ORAL | 0 refills | Status: AC

## 2019-06-29 MED ORDER — ISOSORBIDE MONONITRATE ER 30 MG PO TB24: 15.0000 mg | ORAL_TABLET | Freq: Every day | ORAL | 0 refills | Status: AC

## 2019-06-29 MED ORDER — BISOPROLOL FUMARATE 5 MG PO TABS: 2.5000 mg | ORAL_TABLET | Freq: Every day | ORAL | 0 refills | Status: DC

## 2019-06-29 MED ORDER — FUROSEMIDE 20 MG PO TABS: 20.0000 mg | ORAL_TABLET | Freq: Every day | ORAL | 0 refills | Status: AC

## 2019-06-29 MED ORDER — ISOSORBIDE MONONITRATE ER 30 MG PO TB24: 15.0000 mg | ORAL_TABLET | Freq: Every day | ORAL | Status: DC

## 2019-06-29 MED ORDER — CLOPIDOGREL BISULFATE 75 MG PO TABS: 75.0000 mg | ORAL_TABLET | Freq: Every day | ORAL | 0 refills | Status: DC

## 2019-06-29 MED ORDER — PREDNISONE 20 MG PO TABS: 20.0000 mg | ORAL_TABLET | Freq: Every day | ORAL | 0 refills | Status: AC

## 2019-06-29 MED ORDER — SPIRONOLACTONE 25 MG PO TABS: 12.5000 mg | ORAL_TABLET | Freq: Every day | ORAL | 0 refills | Status: AC

## 2019-06-29 NOTE — Evaluation (Signed)
Physical Therapy Evaluation Patient Details Name: Annette IdeBrenda Morrison MRN: [id number] DOB: 1956-03-09 Today's Date: 06/29/2019   History of Present Illness  63 year old female history of hypertension dyslipidemia tobacco abuse COPD, hypothyroidism presented to the emergency room with progressive dyspnea on exertion for 2 to 3 weeks worsened the night prior to admission, chest x-ray indicated CHF, she was also noted to be wheezing, on work-up noted to have mildly elevated troponin with nonspecific EKG, seen by cardiology 2D echo in the emergency room showed depressed EF of 40% with new wall motion abnormalities, Admitted 8/21/20for treatment of Acute respiratory failure with hypoxia and Non-STEMI/acute systolic CHF.   Clinical Impression  PTA pt living with significant other in single story home with 4 steps to enter. Pt was independent in mobility and iADLs. Pt is currently limited in safe mobility by decreased dynamic balance. Pt is supervision for bed mobility and transfers and min guard for ambulation without AD and ascent/descent of 6 steps. Pt exhibits mild instability with higher level dynamic activities. PT recommending Outpatient Neuro PT at discharge to work on higher level balance activities. PT will continue to follow acutely.     Follow Up Recommendations Outpatient PT (Neuro)    Equipment Recommendations  None recommended by PT    Recommendations for Other Services       Precautions / Restrictions Precautions Precautions: Fall Restrictions Weight Bearing Restrictions: No      Mobility  Bed Mobility Overal bed mobility: Needs Assistance Bed Mobility: Supine to Sit     Supine to sit: Supervision     General bed mobility comments: supervision for safety and line management  Transfers Overall transfer level: Needs assistance Equipment used: None Transfers: Sit to/from Stand Sit to Stand: Supervision;Min guard         General transfer comment: supervision-minguard for  safety, increase awareness to lines  Ambulation/Gait Ambulation/Gait assistance: Min guard Gait Distance (Feet): 400 Feet Assistive device: None Gait Pattern/deviations: Step-through pattern;Decreased step length - right;Decreased step length - left;Drifts right/left;Narrow base of support;Scissoring Gait velocity: slowed Gait velocity interpretation: <1.31 ft/sec, indicative of household ambulator General Gait Details: min guard for safety due to mild instability, pt with decreased BoS creating drifting and occasional scissoring with gait  Stairs Stairs: Yes Stairs assistance: Min guard Stair Management: One rail Right;One rail Left;Alternating pattern;Forwards Number of Stairs: 6 General stair comments: hands on min guard for safety with ascent/descent mild instability but able to self steady with use of rail   Wheelchair Mobility    Modified Rankin (Stroke Patients Only)       Balance Overall balance assessment: Needs assistance Sitting-balance support: Feet supported;No upper extremity supported Sitting balance-Leahy Scale: Good     Standing balance support: No upper extremity supported;During functional activity Standing balance-Leahy Scale: Fair               High level balance activites: Head turns;Sudden stops;Turns High Level Balance Comments: mild instability with higher level activities, turns and stops require an additional step to steady             Pertinent Vitals/Pain Pain Assessment: No/denies pain    Home Living Family/patient expects to be discharged to:: Private residence Living Arrangements: Spouse/significant other Available Help at Discharge: Available 24 hours/day Type of Home: House Home Access: Stairs to enter   [employer] Corporation[employer]trance Stairs-Number of Steps: 2 Home Layout: One level Home Equipment: Walker - 2 wheels;Grab bars - tub/shower;Grab bars - toilet;Bedside commode;Wheelchair - manual Additional Comments: pt's significant other's late  wife  has DME    Prior Function Level of Independence: Independent         Comments: pt was driving, independent in all aspects     Hand Dominance   Dominant Hand: Right    Extremity/Trunk Assessment   Upper Extremity Assessment Upper Extremity Assessment: Overall WFL for tasks assessed    Lower Extremity Assessment Lower Extremity Assessment: Overall WFL for tasks assessed    Cervical / Trunk Assessment Cervical / Trunk Assessment: Normal  Communication   Communication: Expressive difficulties  Cognition Arousal/Alertness: Awake/alert Behavior During Therapy: WFL for tasks assessed/performed Overall Cognitive Status: Impaired/Different from baseline Area of Impairment: Following commands;Problem solving;Awareness(word finding difficulties)                       Following Commands: Follows multi-step commands inconsistently   Awareness: Emergent Problem Solving: Slow processing;Requires verbal cues General Comments: pt aware of word finding difficulties;requires increased processing time;educated on BE FAST acronym;decreased awareness of lines, vc to increase awareness      General Comments General comments (skin integrity, edema, etc.): VSS    Exercises     Assessment/Plan    PT Assessment Patient needs continued PT services  PT Problem List Decreased balance;Decreased coordination       PT Treatment Interventions DME instruction;Gait training;Stair training;Functional mobility training;Therapeutic activities;Therapeutic exercise;Balance training;Cognitive remediation;Patient/family education;Neuromuscular re-education    PT Goals (Current goals can be found in the Care Plan section)  Acute Rehab PT Goals Patient Stated Goal: to go home PT Goal Formulation: With patient Time For Goal Achievement: 07/13/19 Potential to Achieve Goals: Good    Frequency Min 4X/week   Barriers to discharge        Co-evaluation               AM-PAC PT  "6 Clicks" Mobility  Outcome Measure Help needed turning from your back to your side while in a flat bed without using bedrails?: None Help needed moving from lying on your back to sitting on the side of a flat bed without using bedrails?: None Help needed moving to and from a bed to a chair (including a wheelchair)?: None Help needed standing up from a chair using your arms (e.g., wheelchair or bedside chair)?: None Help needed to walk in hospital room?: None Help needed climbing 3-5 steps with a railing? : A Little 6 Click Score: 23    End of Session Equipment Utilized During Treatment: Gait belt Activity Tolerance: Patient tolerated treatment well Patient left: in bed;with call bell/phone within reach;with family/visitor present Nurse Communication: Mobility status PT Visit Diagnosis: Unsteadiness on feet (R26.81);Muscle weakness (generalized) (M62.81);Difficulty in walking, not elsewhere classified (R26.2);Other abnormalities of gait and mobility (R26.89)    Time: 7510-2585 PT Time Calculation (min) (ACUTE ONLY): 20 min   Charges:   PT Evaluation $PT Eval Moderate Complexity: 1 Mod          Reily Ilic B. Migdalia Dk PT, DPT Acute Rehabilitation Services Pager [phone] Office [phone]   [place] 06/29/2019, 1:16 PM

## 2019-06-29 NOTE — Progress Notes (Signed)
SLP Cancellation Note  Patient Details Name: Annette Morrison MRN: [id number] DOB: 1956/04/19   Cancelled treatment:       Reason Eval/Treat Not Completed: Other (comment). Pt confirms difficulty word finding over the past two weeks and is noted to have a few paraphasias in conversational speech. She will d/c home today, recommend comprehensive evaluation at Outpatient SLP setting. Discussed with pt and husband who are in agreement.    Izzy Courville, Katherene Ponto 06/29/2019, 11:36 AM

## 2019-06-29 NOTE — Progress Notes (Addendum)
Occupational Therapy Evaluation Patient Details Name: Annette Morrison MRN: [id number] DOB: 11/29/1955 Today's Date: 06/29/2019    History of Present Illness 63 year old female history of hypertension dyslipidemia tobacco abuse COPD, hypothyroidism presented to the emergency room with progressive dyspnea on exertion for 2 to 3 weeks worsened the night prior to admission, chest x-ray indicated CHF, she was also noted to be wheezing, on work-up noted to have mildly elevated troponin with nonspecific EKG, seen by cardiology 2D echo in the emergency room showed depressed EF of 40% with new wall motion abnormalities, Admitted 8/21/20for treatment of Acute respiratory failure with hypoxia and Non-STEMI/acute systolic CHF.    Clinical Impression   PTA, pt was living at home with her significant other, pt reports she was independent with ADL/IADL and functional mobility, pt was driving. Pt currently requires supervision-minguard for ADL completion and functional mobility without AD. Pt continues to demonstrate word finding difficulties, but reports she is aware of difficulty. Educated pt on compensatory strategies, recommend speech consult. Pt demonstrate cognitive limitations and physical limitations impacting safety and independence with ADL/IADL and functional mobility. Pt reports her significant other is available to assist 24/7, anticipate she will progress toward baseline with Speech followup if appropriate and support from her significant other. Pt will benefit from acute OT to progress toward established OT goals, do not anticipate pt will need OT follow-up. Will continue to follow acutely.  vital signs within normal range throughout session;HR 70 at rest up to 89 bpm with functional mobility;SpO2 >97% on RA    Follow Up Recommendations  No OT follow up;Supervision - Intermittent(with mobility )    Equipment Recommendations  None recommended by OT    Recommendations for Other Services Speech  consult     Precautions / Restrictions Precautions Precautions: Fall Restrictions Weight Bearing Restrictions: No      Mobility Bed Mobility Overal bed mobility: Needs Assistance Bed Mobility: Supine to Sit     Supine to sit: Supervision     General bed mobility comments: supervision for safety and line management  Transfers Overall transfer level: Needs assistance Equipment used: None Transfers: Sit to/from Stand Sit to Stand: Supervision;Min guard         General transfer comment: supervision-minguard for safety, increase awareness to lines    Balance Overall balance assessment: Mild deficits observed, not formally tested                                         ADL either performed or assessed with clinical judgement   ADL Overall ADL's : Needs assistance/impaired Eating/Feeding: Supervision/ safety Eating/Feeding Details (indicate cue type and reason): supervision to navigate table in room Grooming: Supervision/safety;Standing   Upper Body Bathing: Supervision/ safety;Sitting   Lower Body Bathing: Supervison/ safety;Sit to/from stand   Upper Body Dressing : Supervision/safety;Sitting   Lower Body Dressing: Supervision/safety;Sit to/from stand   Toilet Transfer: Supervision/safety;Min guard;Ambulation Toilet Transfer Details (indicate cue type and reason): simulated;minguard for awareness of lines Toileting- Clothing Manipulation and Hygiene: Supervision/safety;Sit to/from stand       Functional mobility during ADLs: Supervision/safety;Min guard General ADL Comments: supervision to minguard for safety due to decreased awareness of lines;pt with decreased processing speed;     Vision Baseline Vision/History: No visual deficits Patient Visual Report: No change from baseline       Perception     Praxis      Pertinent Vitals/Pain Pain Assessment:  No/denies pain     Hand Dominance Right   Extremity/Trunk Assessment Upper  Extremity Assessment Upper Extremity Assessment: Overall WFL for tasks assessed   Lower Extremity Assessment Lower Extremity Assessment: Overall WFL for tasks assessed   Cervical / Trunk Assessment Cervical / Trunk Assessment: Normal   Communication Communication Communication: Expressive difficulties   Cognition Arousal/Alertness: Awake/alert Behavior During Therapy: WFL for tasks assessed/performed Overall Cognitive Status: Impaired/Different from baseline Area of Impairment: Following commands;Problem solving;Awareness(word finding difficulties)                       Following Commands: Follows multi-step commands inconsistently   Awareness: Emergent Problem Solving: Slow processing;Requires verbal cues General Comments: pt aware of word finding difficulties;requires increased processing time;educated on BE FAST acronym;decreased awareness of lines, vc to increase awareness   General Comments  vital signs within normal range throughout session;HR 70 at rest up to 89 bpm with functional mobility;SpO2 >97% on RA    Exercises     Shoulder Instructions      Home Living Family/patient expects to be discharged to:: Private residence Living Arrangements: Spouse/significant other Available Help at Discharge: Available 24 hours/day Type of Home: House Home Access: Stairs to enter [employer] of Steps: 2   Home Layout: One level     Bathroom Shower/Tub: Other (comment)(walk in tub)   Bathroom Toilet: Standard Bathroom Accessibility: Yes How Accessible: Accessible via walker Home Equipment: Walker - 2 wheels;Grab bars - tub/shower;Grab bars - toilet;Bedside commode;Wheelchair - manual   Additional Comments: pt's significant other's late wife has DME      Prior Functioning/Environment Level of Independence: Independent        Comments: pt was driving, independent in all aspects        OT Problem List: Impaired balance (sitting and/or  standing);Decreased safety awareness;Decreased cognition      OT Treatment/Interventions:      OT Goals(Current goals can be found in the care plan section) Acute Rehab OT Goals Patient Stated Goal: to go home OT Goal Formulation: With patient Time For Goal Achievement: 07/13/19 Potential to Achieve Goals: Good ADL Goals Pt Will Perform Grooming: Independently Pt Will Perform Upper Body Dressing: Independently Pt Will Perform Lower Body Dressing: Independently Pt Will Transfer to Toilet: Independently Additional ADL Goal #1: Pt will demonstrate independence with medication management.  OT Frequency:     Barriers to D/C:            Co-evaluation              AM-PAC OT "6 Clicks" Daily Activity     Outcome Measure Help from another person eating meals?: A Little Help from another person taking care of personal grooming?: A Little Help from another person toileting, which includes using toliet, bedpan, or urinal?: A Little Help from another person bathing (including washing, rinsing, drying)?: A Little Help from another person to put on and taking off regular upper body clothing?: A Little Help from another person to put on and taking off regular lower body clothing?: A Little 6 Click Score: 18   End of Session Equipment Utilized During Treatment: Gait belt Nurse Communication: Mobility status  Activity Tolerance: Patient tolerated treatment well Patient left: in chair;with call bell/phone within reach;with chair alarm set  OT Visit Diagnosis: Other abnormalities of gait and mobility (R26.89);Other symptoms and signs involving cognitive function                Time: 6629-4765 OT Time Calculation (  min): 20 min Charges:  OT General Charges $OT Visit: 1 Visit OT Evaluation $OT Eval Moderate Complexity: 1 Mod  Diona Brownereresa Mariapaula Krist OTR/L Acute Rehabilitation Services Office: [phone]   Rebeca Alerteresa J Verdella Laidlaw 06/29/2019, 10:40 AM

## 2019-06-29 NOTE — Progress Notes (Signed)
Progress Note  Patient Name: Annette Morrison Date of Encounter: 06/29/2019  Primary Cardiologist: Nona Dell, MD   Subjective   Feeling excellent. No further chest pain. Hasn't ambulated yet.   Inpatient Medications    Scheduled Meds:  aspirin EC  81 mg Oral Daily   bisoprolol  2.5 mg Oral Daily   clopidogrel  75 mg Oral Q breakfast   enoxaparin (LOVENOX) injection  40 mg Subcutaneous Q24H   famotidine  20 mg Oral Daily   furosemide  40 mg Oral Daily   levothyroxine  125 mcg Oral Q0600   nicotine  21 mg Transdermal Daily   nitroGLYCERIN  0.4 mg Transdermal Daily   potassium chloride  20 mEq Oral Daily   predniSONE  40 mg Oral Q breakfast   rosuvastatin  20 mg Oral Daily   sodium chloride flush  3 mL Intravenous Q12H   sodium chloride flush  3 mL Intravenous Q12H   sodium chloride flush  3 mL Intravenous Q12H   spironolactone  12.5 mg Oral Daily   Continuous Infusions:  sodium chloride 250 mL (06/26/19 0959)   sodium chloride     PRN Meds: sodium chloride, sodium chloride, acetaminophen, HYDROcodone-acetaminophen, ipratropium-albuterol, ondansetron (ZOFRAN) IV, sodium chloride flush, sodium chloride flush   Vital Signs    Vitals:   06/28/19 2012 06/29/19 0704 06/29/19 0705 06/29/19 0815  BP: 117/75 112/81  103/86  Pulse: 87 74 87 85  Resp: 17  20 19   Temp: 97.6 F (36.4 C) 97.6 F (36.4 C)    TempSrc: Oral Oral    SpO2: 93% 95% 96% 96%  Weight:   48.3 kg   Height:        Intake/Output Summary (Last 24 hours) at 06/29/2019 0902 Last data filed at 06/29/2019 0358 Gross per 24 hour  Intake 485.64 ml  Output 1550 ml  Net -1064.36 ml   Last 3 Weights 06/29/2019 06/28/2019 06/27/2019  Weight (lbs) 106 lb 6.4 oz 107 lb 6.4 oz 107 lb 5.8 oz  Weight (kg) 48.263 kg 48.716 kg 48.7 kg      Telemetry    NSR - Personally Reviewed  ECG    No new tracing  Physical Exam   GEN: No acute distress.   Neck: No JVD Cardiac: RRR, no murmurs,  rubs, or gallops. Right radial cath site without hematoma  Respiratory: Clear to auscultation bilaterally. GI: Soft, nontender, non-distended  MS: No edema; No deformity. Neuro:  Nonfocal  Psych: Normal affect   Labs    High Sensitivity Troponin:   Recent Labs  Lab 06/25/19 0803 06/25/19 1031 06/25/19 2002 06/25/19 2234 06/28/19 1026  TROPONINIHS 371* 337* 325* 283* 115*      Chemistry Recent Labs  Lab 06/25/19 0803  06/27/19 0348 06/28/19 0353 06/29/19 0346  NA 143   < > 139 138 136  K 3.5   < > 4.5 4.8 4.1  CL 107   < > 100 102 100  CO2 22   < > 24 23 24   GLUCOSE 135*   < > 130* 122* 97  BUN 12   < > 24* 31* 31*  CREATININE 0.79   < > 0.83 0.93 0.82  CALCIUM 9.2   < > 9.8 10.0 9.8  PROT 7.4  --   --   --   --   ALBUMIN 3.0*  --   --   --   --   AST 17  --   --   --   --  ALT <5  --   --   --   --   ALKPHOS 114  --   --   --   --   BILITOT 0.1*  --   --   --   --   GFRNONAA >60   < > >60 >60 >60  GFRAA >60   < > >60 >60 >60  ANIONGAP 14   < > 15 13 12    < > = values in this interval not displayed.     Hematology Recent Labs  Lab 06/27/19 0348 06/28/19 0353 06/29/19 0346  WBC 14.3* 14.9* 15.1*  RBC 4.18 4.29 4.34  HGB 12.2 12.5 12.7  HCT 38.2 39.4 39.3  MCV 91.4 91.8 90.6  MCH 29.2 29.1 29.3  MCHC 31.9 31.7 32.3  RDW 14.7 14.7 14.6  PLT 418* 366 359    BNP Recent Labs  Lab 06/25/19 0803  BNP 649.0*     Radiology    Ct Angio Head W Or Wo Contrast  Result Date: 06/27/2019 CLINICAL DATA:  Stroke, follow-up. Right border zone punctate infarcts. Remote lacunar infarct of the right thalamus. EXAM: CT ANGIOGRAPHY HEAD AND NECK TECHNIQUE: Multidetector CT imaging of the head and neck was performed using the standard protocol during bolus administration of intravenous contrast. Multiplanar CT image reconstructions and MIPs were obtained to evaluate the vascular anatomy. Carotid stenosis measurements (when applicable) are obtained utilizing NASCET  criteria, using the distal internal carotid diameter as the denominator. CONTRAST:  50mL OMNIPAQUE IOHEXOL 350 MG/ML SOLN COMPARISON:  MR head without contrast 06/26/2019. CT head without contrast 06/25/2019. FINDINGS: CT HEAD FINDINGS Brain: The punctate acute infarcts on MRI are below the resolution of CT. No acute cortical infarcts are evident. Basal ganglia are intact. Scattered white matter changes are again noted. The ventricles are of normal size. No significant extraaxial fluid collection is present. The brainstem and cerebellum are within normal limits. Vascular: Atherosclerotic calcifications are present within the cavernous internal carotid arteries. There is no hyperdense vessel. Skull: Calvarium is intact. No focal lytic or blastic lesions are present. Sinuses: The paranasal sinuses and mastoid air cells are clear. Orbits: The globes and orbits are within normal limits. Review of the MIP images confirms the above findings CTA NECK FINDINGS Aortic arch: A 4 vessel arch configuration is present. There dense calcifications with a high-grade stenosis at the origin of the left vertebral artery. Minimal calcifications are present at the left subclavian artery without a significant stenosis. Minimal calcifications are present at the innominate artery without significant stenosis. Right carotid system: Right common carotid artery is within normal limits. Minimal irregularity is present at the bifurcation and proximal right ICA without a significant stenosis. The cervical right ICA is otherwise normal. Left carotid system: The left common carotid artery is within normal limits. Atherosclerotic changes are present at the bifurcation without a significant stenosis. There is a posteromedial plaque in the proximal left ICA without a significant stenosis. There is slight narrowing of the distal left cervical ICA just below the skull base without a significant stenosis. Vertebral arteries: The right vertebral artery  is the dominant vessel. It originates from the subclavian artery. There are no tandem stenoses in the left vertebral artery. There is no focal stenosis of the right vertebral artery in the neck. Skeleton: Asymmetric left-sided facet disease is present in the cervical spine, most evident at C3-4 and C4-5 with foraminal narrowing at these levels. Vertebral body heights and alignment are maintained. No focal lytic or blastic  lesions are present. Other neck: The soft tissues of the neck are otherwise unremarkable. There is mild heterogeneity of the thyroid without a dominant lesion. Salivary glands are normal. No significant adenopathy is present. Upper chest: Centrilobular emphysematous changes are present. No focal nodule or mass lesion is evident. There is scarring at the right apex. Review of the MIP images confirms the above findings CTA HEAD FINDINGS Anterior circulation: Atherosclerotic changes are present in the cavernous internal carotid arteries bilaterally. There is no significant stenosis relative to the more distal vessels. The ICA termini are within normal limits bilaterally. The A1 and M1 segments are normal. MCA bifurcations are intact. ACA and MCA branch vessels are normal. Posterior circulation: Right vertebral artery is the dominant vessel. There is mild narrowing at the dural margin of the right vertebral artery without a significant stenosis relative to the more distal vessel. Vertebrobasilar junction is normal. There is mild narrowing of proximal basilar artery as well. The right posterior cerebral artery originates from the basilar tip. The left posterior cerebral artery is of fetal type. There is some irregularity of distal PCA branch vessels without a significant proximal stenosis or occlusion. Venous sinuses: The dural sinuses are patent. Straight sinus deep cerebral veins are intact. Cortical veins are unremarkable. Anatomic variants: Fetal type left posterior cerebral artery. Review of the  MIP images confirms the above findings IMPRESSION: 1. 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 There is dense calcification and high-grade stenosis at the origin of the left vertebral artery. 2. It is unclear this impacts the areas of infarct given that the left posterior cerebral artery is of fetal type. 3. Atherosclerotic changes at the proximal left internal carotid artery without significant stenosis could be a source of emboli. 4. Minimal atherosclerotic changes at the aortic arch in within the cavernous internal carotid arteries otherwise. 5. Asymmetric left-sided facet disease most notable at C3-4 and C4-5. 6.  Emphysema (ICD10-J43.9). Electronically Signed   By: Marin Robertshristopher  Mattern M.D.   On: 06/27/2019 14:00   Ct Angio Neck W Or Wo Contrast  Result Date: 06/27/2019 CLINICAL DATA:  Stroke, follow-up. Right border zone punctate infarcts. Remote lacunar infarct of the right thalamus. EXAM: CT ANGIOGRAPHY HEAD AND NECK TECHNIQUE: Multidetector CT imaging of the head and neck was performed using the standard protocol during bolus administration of intravenous contrast. Multiplanar CT image reconstructions and MIPs were obtained to evaluate the vascular anatomy. Carotid stenosis measurements (when applicable) are obtained utilizing NASCET criteria, using the distal internal carotid diameter as the denominator. CONTRAST:  50mL OMNIPAQUE IOHEXOL 350 MG/ML SOLN COMPARISON:  MR head without contrast 06/26/2019. CT head without contrast 06/25/2019. FINDINGS: CT HEAD FINDINGS Brain: The punctate acute infarcts on MRI are below the resolution of CT. No acute cortical infarcts are evident. Basal ganglia are intact. Scattered white matter changes are again noted. The ventricles are of normal size. No significant extraaxial fluid collection is present. The brainstem and cerebellum are within normal limits. Vascular: Atherosclerotic calcifications are present within the cavernous internal carotid arteries.  There is no hyperdense vessel. Skull: Calvarium is intact. No focal lytic or blastic lesions are present. Sinuses: The paranasal sinuses and mastoid air cells are clear. Orbits: The globes and orbits are within normal limits. Review of the MIP images confirms the above findings CTA NECK FINDINGS Aortic arch: A 4 vessel arch configuration is present. There dense calcifications with a high-grade stenosis at the origin of the left vertebral artery. Minimal calcifications are present at the left  subclavian artery without a significant stenosis. Minimal calcifications are present at the innominate artery without significant stenosis. Right carotid system: Right common carotid artery is within normal limits. Minimal irregularity is present at the bifurcation and proximal right ICA without a significant stenosis. The cervical right ICA is otherwise normal. Left carotid system: The left common carotid artery is within normal limits. Atherosclerotic changes are present at the bifurcation without a significant stenosis. There is a posteromedial plaque in the proximal left ICA without a significant stenosis. There is slight narrowing of the distal left cervical ICA just below the skull base without a significant stenosis. Vertebral arteries: The right vertebral artery is the dominant vessel. It originates from the subclavian artery. There are no tandem stenoses in the left vertebral artery. There is no focal stenosis of the right vertebral artery in the neck. Skeleton: Asymmetric left-sided facet disease is present in the cervical spine, most evident at C3-4 and C4-5 with foraminal narrowing at these levels. Vertebral body heights and alignment are maintained. No focal lytic or blastic lesions are present. Other neck: The soft tissues of the neck are otherwise unremarkable. There is mild heterogeneity of the thyroid without a dominant lesion. Salivary glands are normal. No significant adenopathy is present. Upper chest:  Centrilobular emphysematous changes are present. No focal nodule or mass lesion is evident. There is scarring at the right apex. Review of the MIP images confirms the above findings CTA HEAD FINDINGS Anterior circulation: Atherosclerotic changes are present in the cavernous internal carotid arteries bilaterally. There is no significant stenosis relative to the more distal vessels. The ICA termini are within normal limits bilaterally. The A1 and M1 segments are normal. MCA bifurcations are intact. ACA and MCA branch vessels are normal. Posterior circulation: Right vertebral artery is the dominant vessel. There is mild narrowing at the dural margin of the right vertebral artery without a significant stenosis relative to the more distal vessel. Vertebrobasilar junction is normal. There is mild narrowing of proximal basilar artery as well. The right posterior cerebral artery originates from the basilar tip. The left posterior cerebral artery is of fetal type. There is some irregularity of distal PCA branch vessels without a significant proximal stenosis or occlusion. Venous sinuses: The dural sinuses are patent. Straight sinus deep cerebral veins are intact. Cortical veins are unremarkable. Anatomic variants: Fetal type left posterior cerebral artery. Review of the MIP images confirms the above findings IMPRESSION: 1. 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 There is dense calcification and high-grade stenosis at the origin of the left vertebral artery. 2. It is unclear this impacts the areas of infarct given that the left posterior cerebral artery is of fetal type. 3. Atherosclerotic changes at the proximal left internal carotid artery without significant stenosis could be a source of emboli. 4. Minimal atherosclerotic changes at the aortic arch in within the cavernous internal carotid arteries otherwise. 5. Asymmetric left-sided facet disease most notable at C3-4 and C4-5. 6.  Emphysema (ICD10-J43.9).  Electronically Signed   By: Marin Roberts M.D.   On: 06/27/2019 14:00   Vas [place] Lower Extremity Venous (dvt)  Result Date: 06/28/2019  Lower Venous Study Indications: Stroke.  Performing Technologist: Jeb Levering RDMS, RVT  Examination Guidelines: A complete evaluation includes B-mode imaging, spectral Doppler, color Doppler, and power Doppler as needed of all accessible portions of each vessel. Bilateral testing is considered an integral part of a complete examination. Limited examinations for reoccurring indications may be performed as noted.  +---------+---------------+---------+-----------+----------+--------------+  RIGHT     Compressibility Phasicity Spontaneity Properties Thrombus Aging  +---------+---------------+---------+-----------+----------+--------------+  CFV       Full            Yes       Yes                                    +---------+---------------+---------+-----------+----------+--------------+  SFJ       Full                                                             +---------+---------------+---------+-----------+----------+--------------+  FV Prox   Full                                                             +---------+---------------+---------+-----------+----------+--------------+  FV Mid    Full                                                             +---------+---------------+---------+-----------+----------+--------------+  FV Distal Full                                                             +---------+---------------+---------+-----------+----------+--------------+  PFV       Full                                                             +---------+---------------+---------+-----------+----------+--------------+  POP       Full            Yes       Yes                                    +---------+---------------+---------+-----------+----------+--------------+  PTV       Full                                                              +---------+---------------+---------+-----------+----------+--------------+  PERO      Full                                                             +---------+---------------+---------+-----------+----------+--------------+   +---------+---------------+---------+-----------+----------+--------------+  LEFT      Compressibility Phasicity Spontaneity Properties Thrombus Aging  +---------+---------------+---------+-----------+----------+--------------+  CFV       Full            Yes       Yes                                    +---------+---------------+---------+-----------+----------+--------------+  SFJ       Full                                                             +---------+---------------+---------+-----------+----------+--------------+  FV Prox   Full                                                             +---------+---------------+---------+-----------+----------+--------------+  FV Mid    Full                                                             +---------+---------------+---------+-----------+----------+--------------+  FV Distal Full                                                             +---------+---------------+---------+-----------+----------+--------------+  PFV       Full                                                             +---------+---------------+---------+-----------+----------+--------------+  POP       Full            Yes       Yes                                    +---------+---------------+---------+-----------+----------+--------------+  PTV       Full                                                             +---------+---------------+---------+-----------+----------+--------------+  PERO      Full                                                             +---------+---------------+---------+-----------+----------+--------------+  Summary: Right: There is no evidence of deep vein thrombosis in the lower extremity. No cystic structure found in the  popliteal fossa. Left: There is no evidence of deep vein thrombosis in the lower extremity. No cystic structure found in the popliteal fossa.  *See table(s) above for measurements and observations. Electronically signed by Ruta Hinds MD on 06/28/2019 at 6:54:08 PM.    Final     Cardiac Studies   Echo 06/25/2019 IMPRESSIONS  1. The left ventricle has mild-moderately reduced systolic function, with an ejection fraction of 40-45%. The cavity size was mildly dilated. Left ventricular diastolic Doppler parameters are consistent with impaired relaxation. 2. There is akinesis of the left ventricular, mid septal wall. 3. There iw akinesis of the left ventricular, basal-mid inferolateral wall. 4. The right ventricle has normal systolic function. The cavity was normal. There is no increase in right ventricular wall thickness. Right ventricular systolic pressure is normal with an estimated pressure of 22.7 mmHg. 5. The aortic valve is tricuspid. Mild calcification of the aortic valve. Mild aortic annular calcification noted. 6. The mitral valve is grossly normal. There is mild mitral annular calcification present. 7. The tricuspid valve is grossly normal. 8. The aorta is normal unless otherwise noted. 9. Evidence of atrial level shunting detected by color flow Doppler.   Cath 06/27/24 Conclusions: 1. Significant multivessel coronary artery disease, as described below.  Culprit lesion(s) for NSTEMI are most likely subtotally occluded small OM1 and OM2 branches with TIMI-2 flow.  There is moderate to severe LAD and RCA disease that is not critical and is unlikely to explain the patient's rest pain. 2. Mildly elevated left ventricular filling pressure.  Recommendations: 1. Medical therapy, including DAPT with aspirin and clopidogrel for 12 months. 2. Aggressive secondary prevention. 3. If the patient has refractory chest pain despite optimal antianginal therapy, revascularization of the LAD  and RCA would need to be considered in the future (ideally when she has recovered from her recent strokes). 4.  Patient Profile     63 y.o. female with hx of hypertension, HLD, hypothyroidism and long standing tobacco smoking admitted with COPD exacerbation. She was found to have newly documented cardiomyopathy with wall motion abnormality suggesting ischemic etiology. Hx of difficulties with speech and word finding problem>> work up reveled subacute stroke.   Assessment & Plan    1. Acute systolic and diastolic CHF - Echo showed LVEF of 40-45%, mild DD ad wall motion abnormality. Suspect ischemic etiology. BNP 649 on admit. Diuresed 4.8L so far. Weight down 14 lb (120>>>106lb). She appears euvolemic.  - Continue Bisoprolol 2.5mg  qd, Lasix 40mg  qd (converted to oral) and Spironolactone 12.5mg  qd - Consider ACE/ARB as outpatient. BP is soft as well as recent stroke.   2. NSTEMI - Peak of troponin 371. Repeat EKG concerning for  possible early stage anterolateral STEMI. Treated with heparin. Recurrent chest pain resolved with addition of nitro patch. Cath showed significant multivessel coronary artery disease, as described above.  Culprit lesion(s) for NSTEMI are most likely subtotally occluded small OM1 and OM2 branches with TIMI-2 flow.  There is moderate to severe LAD and RCA disease that is not critical and is unlikely to explain the patient's rest pain. Recommended DAPT with ASA and Plavix for 2 months. Continue BB and statin.  - If the patient has refractory chest pain despite optimal antianginal therapy, revascularization of the LAD and RCA would need to be considered in the future (ideally when she has recovered from her recent strokes).  3. Acute/Subacute stroke - Reported 3 weeks hx of difficulties with speech and word finding problem - Followed by neurology - Will get outpatient event monitor  4. HLD - 06/28/2019: Cholesterol 163; HDL 58; LDL Cholesterol 38; Triglycerides 334; VLDL 67   - Increase Crestor to 40mg  qd - Lipid panel and LFTS in 6 weeks  5. Tobacco smoking - Encouraged cessation  PT/OT today. If no recurrent chest pain>> okay to discharge on cardiac stand point.  [facility] will sign off.   Medication Recommendations:  ASA 81mg  qd, Plavix 75mg  qd, Bisoprolol 2.5mg  BID, Lasix 40mg  qd (may reduce to 20mg  qd per MD), spironolactone 12.5mg  qd, Crestor 40mg  qd and imdur 15mg  qd Other recommendations (labs, testing, etc):  Add ACE/ARB as outpatient  And event monitor Follow up as an outpatient:  Will arrange with Dr. Diona BrownerMcDowell  For questions or updates, please contact [facility] Please consult [email or web address] for contact info under        SignedManson Passey, Charlayne Vultaggio, PA  06/29/2019, 9:02 AM

## 2019-06-29 NOTE — Care Management (Signed)
06-29-19 1411 Physical Therapy recommendations for Outpatient PT- ambulatory referral placed in Epic. Neuro Rehab will contact patient for transition of care needs. No further needs from CM at this time. Bethena Roys, RN,BSN Case Manager [phone]

## 2019-06-30 ENCOUNTER — Telehealth: Payer: Self-pay | Admitting: *Deleted

## 2019-06-30 NOTE — Telephone Encounter (Signed)
Call regarding TOC recall and event monitor. No answer. Left msg to call back

## 2019-07-01 ENCOUNTER — Telehealth: Payer: Self-pay | Admitting: *Deleted

## 2019-07-01 NOTE — Telephone Encounter (Signed)
Patient contacted regarding discharge from [facility] on 06/25/19.  Patient understands to follow up with provider Domenic Polite on 07/14/19 at 2:30 at [facility] . Patient understands discharge instructions? Yes  Patient understands medications and regiment? Yes  Patient understands to bring all medications to this visit? Yes    Postop Surgical Patients:                What is your wound status? Any signs/ symptoms of infection (Temp, redness/ red streaks, swelling, purulent drainage, foul odor or smell)? .             Please do not place any creams/ lotions/ or antibiotic ointment on any surgical incisions/ wounds without physician approval. .             Do you have any questions about your medications?  All medications (except pain medications) are to be filled by your Cardiologist AFTER your first post op       appointment with them.  Are you taking your pain medication? .             How is your pain controlled? Pain level? .             If you require a refill on pain medications, know that the same medication/ amount may not be prescribed or a refill may not be given.  Please contact your pharmacy for refill requests.  .             Do you have help at home with ADL's?  If you have home health, have you been contacted or seen by the agency? .             Please refer to your Pre/post surgery booklet, there is a lot of useful information in it that may answer any questions you may have. .             Please note that it is ok to remove your surgical dressing, shower (soap/ water), and pat the incision dry.  For surgery related questions staff will route a phone note to [facility] pool  [facility] [place], [place] [id number]  For nonsurgery patients please delete the surgery questions.  For patients

## 2019-07-06 NOTE — Discharge Summary (Signed)
Physician Discharge Summary  Malayshia All ZOX:[id number] DOB: [birth year] DOA: 06/25/2019  PCP: Patient, No Pcp Per  Admit date: 06/25/2019 Discharge date: 06/29/2019  Time spent: 35 minutes  Recommendations for Outpatient Follow-up:  1. PCP in 1 week 2. [facility] in 1 month 3. Cardiology in 1-2weeks   Discharge Diagnoses:    CAD   NSTEMI   Acute systolic CHF   Essential hypertension   Tobacco use disorder   Hyperlipidemia   Hypothyroidism   Acute respiratory failure (HCC)   COPD exacerbation (HCC)   Acute systolic CHF (congestive heart failure) (HCC)   Slurred speech   Cerebral embolism with cerebral infarction   Non-ST elevation (NSTEMI) myocardial infarction (HCC)   Systolic congestive heart failure [facility])   Discharge Condition: improved  Diet recommendation: heart healthy  Filed Weights   06/27/19 0347 06/28/19 0600 06/29/19 0705  Weight: 48.7 kg 48.7 kg 48.3 kg    History of present illness:  63 year old female history of hypertension dyslipidemia tobacco abuse COPD, hypothyroidism presented to the emergency room with progressive dyspnea on exertion for 2 to 3 weeks worsened the night prior to admission, chest x-ray indicated CHF, she was also noted to be wheezing, on work-up noted to have mildly elevated troponin with nonspecific EKG  Hospital Course:    Acute respiratory failure with hypoxia -Primarily from CHF and COPD exacerbation -As below  Acute systolic CHF/STEMI now -EF 40 to 45% with multiple new wall motion abnormalities -Continue aspirin, diuresed with IV Lasix intially, started on low-dose bisoprolol -Clinically much improved with diuresis -underwent LHC yesterday, Cath showed significant multivessel coronary artery disease, as described above. Culprit lesion(s) for NSTEMI felt to be subtotally occluded small OM1 and OM2 branches, also has moderate to severe LAD and RCA disease that is not critical and is unlikely to explain the patient's  rest pain. Cardiology recommended DAPT with ASA and Plavix for 2 months, along with BB and statin - If the patient has refractory chest pain despite optimal antianginal therapy, revascularization of the LAD and RCA would need to be considered in the future. -started on Po lasix and aldactone too -FU wit Cards in 1-2weeks  Mild COPD exacerbation -Was started on steroids and nebulizations on admission 8/21 -No further wheezing at this time, transition to prednisone taper, duonebs  Subacute embolic CVA  -Patient reported slurred speech with word finding difficulty started 2 to 3 weeks ago -CT was on admission was unrevealing, speech considerably better -MRI brain noted 3 punctate posterior border zone infarcts and right thalamus lacunar infarct, suspected to be embolic from low EF -Neurology consulted, continue aspirin, cardiac work-up and recommended 30-day monitor discharge -CTA head and neck noted dense calcification and high-grade stenosis origin L VA.atherosclerosis ofproximal L ICA -2D echocardiogram as noted above with EF of 40 to 45% with new wall motion abnormalities -Continue aspirin, high intensity statin -PT, OT, SLP evaluations completed cleared  Dyslipidemia -Continue statin  Hypothyroidism -Continue Synthroid   Procedure Conclusions: 1. Significant multivessel coronary artery disease, as described below. Culprit lesion(s) for NSTEMI are most likely subtotally occluded small OM1 and OM2 branches with TIMI-2 flow. There is moderate to severe LAD and RCA disease that is not critical and is unlikely to explain the patient's rest pain. 2. Mildly elevated left ventricular filling pressure  Discharge Exam: Vitals:   06/29/19 0815 06/29/19 1410  BP: 103/86 106/70  Pulse: 85 89  Resp: 19 18  Temp:  98.2 F (36.8 C)  SpO2: 96% 97%  General: AAOx3 Cardiovascular: S1S2/RRR Respiratory: CTAB  Discharge Instructions   Discharge Instructions    AMB Referral to  Cardiac Rehabilitation - Phase II   Complete by: As directed    Diagnosis: NSTEMI   After initial evaluation and assessments completed: Virtual Based Care may be provided alone or in conjunction with Phase 2 Cardiac Rehab based on patient barriers.: Yes   Ambulatory referral to Neurology   Complete by: As directed    Follow up with stroke clinic NP (Jessica Vanschaick or Darrol Angelarolyn Martin, if both not available, consider Manson AllanSethi, Penumali, or Ahern) [facility] in about 4 weeks. Thanks.   Ambulatory referral to Physical Therapy   Complete by: As directed    Evaluation and Treatment   Diet - low sodium heart healthy   Complete by: As directed    Increase activity slowly   Complete by: As directed      Allergies as of 06/29/2019      Reactions   Brisdelle [paroxetine Mesylate] Other (See Comments)   seizure   Bee Venom Rash   Hydrocodone Rash      Medication List    STOP taking these medications   atorvastatin 20 MG tablet Commonly known as: LIPITOR   losartan 100 MG tablet Commonly known as: COZAAR     TAKE these medications   aspirin 81 MG EC tablet Take 1 tablet (81 mg total) by mouth daily.   bisoprolol 5 MG tablet Commonly known as: ZEBETA Take 0.5 tablets (2.5 mg total) by mouth daily.   clopidogrel 75 MG tablet Commonly known as: PLAVIX Take 1 tablet (75 mg total) by mouth daily with breakfast.   estrogens (conjugated) 0.625 MG tablet Commonly known as: PREMARIN Take 0.625 mg by mouth every morning.   famotidine 20 MG tablet Commonly known as: PEPCID Take 1 tablet (20 mg total) by mouth daily. What changed: when to take this   furosemide 20 MG tablet Commonly known as: LASIX Take 1 tablet (20 mg total) by mouth daily.   isosorbide mononitrate 30 MG 24 hr tablet Commonly known as: IMDUR Take 0.5 tablets (15 mg total) by mouth daily.   levothyroxine 125 MCG tablet Commonly known as: SYNTHROID Take 125 mcg by mouth every morning. What changed: Another  medication with the same name was removed. Continue taking this medication, and follow the directions you see here.   rosuvastatin 20 MG tablet Commonly known as: CRESTOR Take 20 mg by mouth every morning.   spironolactone 25 MG tablet Commonly known as: ALDACTONE Take 0.5 tablets (12.5 mg total) by mouth daily.   traMADol 50 MG tablet Commonly known as: ULTRAM Take 50 mg by mouth daily as needed (pain).   zolpidem 10 MG tablet Commonly known as: AMBIEN Take 10 mg by mouth at bedtime as needed for sleep.     ASK your doctor about these medications   predniSONE 20 MG tablet Commonly known as: DELTASONE Take 1 tablet (20 mg total) by mouth daily with breakfast for 2 days. Ask about: Should I take this medication?      Allergies  Allergen Reactions  . Brisdelle [Paroxetine Mesylate] Other (See Comments)    seizure  . Bee Venom Rash  . Hydrocodone Rash   Follow-up Information    [facility]. Schedule an appointment as soon as possible for a visit in 4 week(s).   Specialty: Neurology Contact information: [address] [place] [id number] [phone]       Nona DellMcDowell, Samuel  G, MD. Nyra Capes on 07/14/2019.   Specialty: Cardiology Why: @2 :40pm for hospital follow up Contact information: [address] [place] [address] [phone]        [facility].   Specialty: Rehabilitation Why: Outpatient Physical Therapy- Office to call you with an appointment time. If the office has not called within 2-3 business days, then please call the office.  Contact information: [address] [id number] mc [place] [address] [phone]           The results of significant diagnostics from this hospitalization (including imaging, microbiology, ancillary and laboratory) are listed below for reference.    Significant Diagnostic Studies: Ct Angio Head W Or Wo  Contrast  Result Date: 06/27/2019 CLINICAL DATA:  Stroke, follow-up. Right border zone punctate infarcts. Remote lacunar infarct of the right thalamus. EXAM: CT ANGIOGRAPHY HEAD AND NECK TECHNIQUE: Multidetector CT imaging of the head and neck was performed using the standard protocol during bolus administration of intravenous contrast. Multiplanar CT image reconstructions and MIPs were obtained to evaluate the vascular anatomy. Carotid stenosis measurements (when applicable) are obtained utilizing NASCET criteria, using the distal internal carotid diameter as the denominator. CONTRAST:  50mL OMNIPAQUE IOHEXOL 350 MG/ML SOLN COMPARISON:  MR head without contrast 06/26/2019. CT head without contrast 06/25/2019. FINDINGS: CT HEAD FINDINGS Brain: The punctate acute infarcts on MRI are below the resolution of CT. No acute cortical infarcts are evident. Basal ganglia are intact. Scattered white matter changes are again noted. The ventricles are of normal size. No significant extraaxial fluid collection is present. The brainstem and cerebellum are within normal limits. Vascular: Atherosclerotic calcifications are present within the cavernous internal carotid arteries. There is no hyperdense vessel. Skull: Calvarium is intact. No focal lytic or blastic lesions are present. Sinuses: The paranasal sinuses and mastoid air cells are clear. Orbits: The globes and orbits are within normal limits. Review of the MIP images confirms the above findings CTA NECK FINDINGS Aortic arch: A 4 vessel arch configuration is present. There dense calcifications with a high-grade stenosis at the origin of the left vertebral artery. Minimal calcifications are present at the left subclavian artery without a significant stenosis. Minimal calcifications are present at the innominate artery without significant stenosis. Right carotid system: Right common carotid artery is within normal limits. Minimal irregularity is present at the bifurcation and  proximal right ICA without a significant stenosis. The cervical right ICA is otherwise normal. Left carotid system: The left common carotid artery is within normal limits. Atherosclerotic changes are present at the bifurcation without a significant stenosis. There is a posteromedial plaque in the proximal left ICA without a significant stenosis. There is slight narrowing of the distal left cervical ICA just below the skull base without a significant stenosis. Vertebral arteries: The right vertebral artery is the dominant vessel. It originates from the subclavian artery. There are no tandem stenoses in the left vertebral artery. There is no focal stenosis of the right vertebral artery in the neck. Skeleton: Asymmetric left-sided facet disease is present in the cervical spine, most evident at C3-4 and C4-5 with foraminal narrowing at these levels. Vertebral body heights and alignment are maintained. No focal lytic or blastic lesions are present. Other neck: The soft tissues of the neck are otherwise unremarkable. There is mild heterogeneity of the thyroid without a dominant lesion. Salivary glands are normal. No significant adenopathy is present. Upper chest: Centrilobular emphysematous changes are present. No focal nodule or mass lesion is  evident. There is scarring at the right apex. Review of the MIP images confirms the above findings CTA HEAD FINDINGS Anterior circulation: Atherosclerotic changes are present in the cavernous internal carotid arteries bilaterally. There is no significant stenosis relative to the more distal vessels. The ICA termini are within normal limits bilaterally. The A1 and M1 segments are normal. MCA bifurcations are intact. ACA and MCA branch vessels are normal. Posterior circulation: Right vertebral artery is the dominant vessel. There is mild narrowing at the dural margin of the right vertebral artery without a significant stenosis relative to the more distal vessel. Vertebrobasilar  junction is normal. There is mild narrowing of proximal basilar artery as well. The right posterior cerebral artery originates from the basilar tip. The left posterior cerebral artery is of fetal type. There is some irregularity of distal PCA branch vessels without a significant proximal stenosis or occlusion. Venous sinuses: The dural sinuses are patent. Straight sinus deep cerebral veins are intact. Cortical veins are unremarkable. Anatomic variants: Fetal type left posterior cerebral artery. Review of the MIP images confirms the above findings IMPRESSION: 1. 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 There is dense calcification and high-grade stenosis at the origin of the left vertebral artery. 2. It is unclear this impacts the areas of infarct given that the left posterior cerebral artery is of fetal type. 3. Atherosclerotic changes at the proximal left internal carotid artery without significant stenosis could be a source of emboli. 4. Minimal atherosclerotic changes at the aortic arch in within the cavernous internal carotid arteries otherwise. 5. Asymmetric left-sided facet disease most notable at C3-4 and C4-5. 6.  Emphysema (ICD10-J43.9). Electronically Signed   By: Marin Roberts M.D.   On: 06/27/2019 14:00   Ct Head Wo Contrast  Result Date: 06/25/2019 CLINICAL DATA:  Reports her Synthroid was titrated approximately 3 weeks ago and following this she started to notice episodes of slurred speech and thought this might be related to her medication adjustment. EXAM: CT HEAD WITHOUT CONTRAST TECHNIQUE: Contiguous axial images were obtained from the base of the skull through the vertex without intravenous contrast. FINDINGS: Brain: No evidence of acute infarction, hemorrhage, hydrocephalus, extra-axial collection or mass lesion/mass effect. Vascular: No hyperdense vessel or unexpected calcification. Skull: Normal. Negative for fracture or focal lesion. Sinuses/Orbits: No acute finding.  Other: None. IMPRESSION: Negative exam. Electronically Signed   By: Norva Pavlov M.D.   On: 06/25/2019 14:07   Ct Angio Neck W Or Wo Contrast  Result Date: 06/27/2019 CLINICAL DATA:  Stroke, follow-up. Right border zone punctate infarcts. Remote lacunar infarct of the right thalamus. EXAM: CT ANGIOGRAPHY HEAD AND NECK TECHNIQUE: Multidetector CT imaging of the head and neck was performed using the standard protocol during bolus administration of intravenous contrast. Multiplanar CT image reconstructions and MIPs were obtained to evaluate the vascular anatomy. Carotid stenosis measurements (when applicable) are obtained utilizing NASCET criteria, using the distal internal carotid diameter as the denominator. CONTRAST:  74mL OMNIPAQUE IOHEXOL 350 MG/ML SOLN COMPARISON:  MR head without contrast 06/26/2019. CT head without contrast 06/25/2019. FINDINGS: CT HEAD FINDINGS Brain: The punctate acute infarcts on MRI are below the resolution of CT. No acute cortical infarcts are evident. Basal ganglia are intact. Scattered white matter changes are again noted. The ventricles are of normal size. No significant extraaxial fluid collection is present. The brainstem and cerebellum are within normal limits. Vascular: Atherosclerotic calcifications are present within the cavernous internal carotid arteries. There is no hyperdense vessel. Skull: Calvarium is  intact. No focal lytic or blastic lesions are present. Sinuses: The paranasal sinuses and mastoid air cells are clear. Orbits: The globes and orbits are within normal limits. Review of the MIP images confirms the above findings CTA NECK FINDINGS Aortic arch: A 4 vessel arch configuration is present. There dense calcifications with a high-grade stenosis at the origin of the left vertebral artery. Minimal calcifications are present at the left subclavian artery without a significant stenosis. Minimal calcifications are present at the innominate artery without significant  stenosis. Right carotid system: Right common carotid artery is within normal limits. Minimal irregularity is present at the bifurcation and proximal right ICA without a significant stenosis. The cervical right ICA is otherwise normal. Left carotid system: The left common carotid artery is within normal limits. Atherosclerotic changes are present at the bifurcation without a significant stenosis. There is a posteromedial plaque in the proximal left ICA without a significant stenosis. There is slight narrowing of the distal left cervical ICA just below the skull base without a significant stenosis. Vertebral arteries: The right vertebral artery is the dominant vessel. It originates from the subclavian artery. There are no tandem stenoses in the left vertebral artery. There is no focal stenosis of the right vertebral artery in the neck. Skeleton: Asymmetric left-sided facet disease is present in the cervical spine, most evident at C3-4 and C4-5 with foraminal narrowing at these levels. Vertebral body heights and alignment are maintained. No focal lytic or blastic lesions are present. Other neck: The soft tissues of the neck are otherwise unremarkable. There is mild heterogeneity of the thyroid without a dominant lesion. Salivary glands are normal. No significant adenopathy is present. Upper chest: Centrilobular emphysematous changes are present. No focal nodule or mass lesion is evident. There is scarring at the right apex. Review of the MIP images confirms the above findings CTA HEAD FINDINGS Anterior circulation: Atherosclerotic changes are present in the cavernous internal carotid arteries bilaterally. There is no significant stenosis relative to the more distal vessels. The ICA termini are within normal limits bilaterally. The A1 and M1 segments are normal. MCA bifurcations are intact. ACA and MCA branch vessels are normal. Posterior circulation: Right vertebral artery is the dominant vessel. There is mild  narrowing at the dural margin of the right vertebral artery without a significant stenosis relative to the more distal vessel. Vertebrobasilar junction is normal. There is mild narrowing of proximal basilar artery as well. The right posterior cerebral artery originates from the basilar tip. The left posterior cerebral artery is of fetal type. There is some irregularity of distal PCA branch vessels without a significant proximal stenosis or occlusion. Venous sinuses: The dural sinuses are patent. Straight sinus deep cerebral veins are intact. Cortical veins are unremarkable. Anatomic variants: Fetal type left posterior cerebral artery. Review of the MIP images confirms the above findings IMPRESSION: 1. 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 There is dense calcification and high-grade stenosis at the origin of the left vertebral artery. 2. It is unclear this impacts the areas of infarct given that the left posterior cerebral artery is of fetal type. 3. Atherosclerotic changes at the proximal left internal carotid artery without significant stenosis could be a source of emboli. 4. Minimal atherosclerotic changes at the aortic arch in within the cavernous internal carotid arteries otherwise. 5. Asymmetric left-sided facet disease most notable at C3-4 and C4-5. 6.  Emphysema (ICD10-J43.9). Electronically Signed   By: Marin Roberts M.D.   On: 06/27/2019 14:00   Mr  Brain Wo Contrast  Result Date: 06/26/2019 CLINICAL DATA:  Episodes of slurred speech and word-finding difficulty. Focal neuro deficits of greater than 6 hours. EXAM: MRI HEAD WITHOUT CONTRAST TECHNIQUE: Multiplanar, multiecho pulse sequences of the brain and surrounding structures were obtained without intravenous contrast. COMPARISON:  CT head without contrast 06/25/2019 FINDINGS: Brain: Diffusion-weighted images demonstrate 3 separate punctate cortical areas of restricted diffusion within a posterior border zone distribution. Other  acute infarcts are present. No acute hemorrhage or mass lesion is present. Periventricular and subcortical T2 hyperintensities are mildly advanced for age. Prominent subcortical focus is present in the anterior left frontal lobe. There is a remote lacunar infarct involving the right thalamus. The internal auditory canals are within normal limits. The brainstem and cerebellum are within normal limits. Vascular: Flow is present in the major intracranial arteries. Skull and upper cervical spine: The craniocervical junction is normal. Upper cervical spine is within normal limits. Marrow signal is unremarkable. Sinuses/Orbits: The paranasal sinuses and mastoid air cells are clear. The globes and orbits are within normal limits. IMPRESSION: 1. At least 3 punctate foci of acute nonhemorrhagic infarct in a posterior border zone distribution. Question posterior circulation the disease in the neck. 2. Remote lacunar infarct of the right thalamus. 3. Scattered white matter disease is mildly advanced for age. The finding is nonspecific but can be seen in the setting of chronic microvascular ischemia, a demyelinating process such as multiple sclerosis, vasculitis, complicated migraine headaches, or as the sequelae of a prior infectious or inflammatory process. Electronically Signed   By: San Morelle M.D.   On: 06/26/2019 15:27   Dg Chest Portable 1 View  Result Date: 06/25/2019 CLINICAL DATA:  Shortness of breath, fever, cough. EXAM: PORTABLE CHEST 1 VIEW COMPARISON:  Radiograph of Mar 11, 2019. FINDINGS: The heart size and mediastinal contours are within normal limits. No pneumothorax is noted. Increased diffuse interstitial densities are noted throughout both lungs consistent with pulmonary edema. Minimal pleural effusions are noted. The visualized skeletal structures are unremarkable. IMPRESSION: Findings most consistent with bilateral pulmonary edema and minimal pleural effusions. Electronically Signed   By:  Marijo Conception M.D.   On: 06/25/2019 06:27   Vas [place] Lower Extremity Venous (dvt)  Result Date: 06/28/2019  Lower Venous Study Indications: Stroke.  Performing Technologist: June Leap RDMS, RVT  Examination Guidelines: A complete evaluation includes B-mode imaging, spectral Doppler, color Doppler, and power Doppler as needed of all accessible portions of each vessel. Bilateral testing is considered an integral part of a complete examination. Limited examinations for reoccurring indications may be performed as noted.  +---------+---------------+---------+-----------+----------+--------------+ RIGHT    CompressibilityPhasicitySpontaneityPropertiesThrombus Aging +---------+---------------+---------+-----------+----------+--------------+ CFV      Full           Yes      Yes                                 +---------+---------------+---------+-----------+----------+--------------+ SFJ      Full                                                        +---------+---------------+---------+-----------+----------+--------------+ FV Prox  Full                                                        +---------+---------------+---------+-----------+----------+--------------+  FV Mid   Full                                                        +---------+---------------+---------+-----------+----------+--------------+ FV DistalFull                                                        +---------+---------------+---------+-----------+----------+--------------+ PFV      Full                                                        +---------+---------------+---------+-----------+----------+--------------+ POP      Full           Yes      Yes                                 +---------+---------------+---------+-----------+----------+--------------+ PTV      Full                                                         +---------+---------------+---------+-----------+----------+--------------+ PERO     Full                                                        +---------+---------------+---------+-----------+----------+--------------+   +---------+---------------+---------+-----------+----------+--------------+ LEFT     CompressibilityPhasicitySpontaneityPropertiesThrombus Aging +---------+---------------+---------+-----------+----------+--------------+ CFV      Full           Yes      Yes                                 +---------+---------------+---------+-----------+----------+--------------+ SFJ      Full                                                        +---------+---------------+---------+-----------+----------+--------------+ FV Prox  Full                                                        +---------+---------------+---------+-----------+----------+--------------+ FV Mid   Full                                                        +---------+---------------+---------+-----------+----------+--------------+  FV DistalFull                                                        +---------+---------------+---------+-----------+----------+--------------+ PFV      Full                                                        +---------+---------------+---------+-----------+----------+--------------+ POP      Full           Yes      Yes                                 +---------+---------------+---------+-----------+----------+--------------+ PTV      Full                                                        +---------+---------------+---------+-----------+----------+--------------+ PERO     Full                                                        +---------+---------------+---------+-----------+----------+--------------+     Summary: Right: There is no evidence of deep vein thrombosis in the lower extremity. No cystic structure found in the  popliteal fossa. Left: There is no evidence of deep vein thrombosis in the lower extremity. No cystic structure found in the popliteal fossa.  *See table(s) above for measurements and observations. Electronically signed by Fabienne Brunsharles Fields MD on 06/28/2019 at 6:54:08 PM.    Final     Microbiology: No results found for this or any previous visit (from the past 240 hour(s)).   Labs: Basic Metabolic Panel: No results for input(s): NA, K, CL, CO2, GLUCOSE, BUN, CREATININE, CALCIUM, MG, PHOS in the last 168 hours. Liver Function Tests: No results for input(s): AST, ALT, ALKPHOS, BILITOT, PROT, ALBUMIN in the last 168 hours. No results for input(s): LIPASE, AMYLASE in the last 168 hours. No results for input(s): AMMONIA in the last 168 hours. CBC: No results for input(s): WBC, NEUTROABS, HGB, HCT, MCV, PLT in the last 168 hours. Cardiac Enzymes: No results for input(s): CKTOTAL, CKMB, CKMBINDEX, TROPONINI in the last 168 hours. BNP: BNP (last 3 results) Recent Labs    06/25/19 0803  BNP 649.0*    ProBNP (last 3 results) No results for input(s): PROBNP in the last 8760 hours.  CBG: No results for input(s): GLUCAP in the last 168 hours.     Signed:  Zannie CovePreetha Janifer Gieselman MD.  [facility] 07/06/2019, 11:34 AM

## 2019-07-13 NOTE — Progress Notes (Deleted)
Cardiology Office Note   Date:  07/13/2019   ID:  Annette Morrison, DOB 1956/09/19, MRN [id number]  PCP:  Patient, No Pcp Per  Cardiologist:  Dr. Domenic Polite     No chief complaint on file.     History of Present Illness: Annette Morrison is a 63 y.o. female who presents for post hospitalizaiton   past medical history of HTN, HLD, Hypothyroidism and tobacco use + FH of CAD with sister in her 58s and father at 28  Admitted with DOE and increased speech issues- + cerebral embolism with CVA, NSTEMI, acute systolic and diastolic HF, COPD exacerbation   Troponin 371 + acute CHF  BNP 649  Echo with EF 40-45% mildly dilated impaired relaxation. akinesis of the left ventricular, mid septal wall, akinesis of the left ventricular, basal-mid inferolateral wall *** She had cardiac cath revealing Significant multivessel coronary artery disease, as described below.  Culprit lesion(s) for NSTEMI are most likely subtotally occluded small OM1 and OM2 branches with TIMI-2 flow.  There is moderate to severe LAD and RCA disease that is not critical and is unlikely to explain the patient's rest pain. Placed on DAPT for 12 months.  Needs secondary prevention.  If the patient has refractory chest pain despite optimal antianginal therapy, revascularization of the LAD and RCA would need to be considered in the future (ideally when she has recovered from her recent strokes).  Was to have 30 day event monitor *** Consider ACE/ARB as outpt -- tobacco use.   Past Medical History:  Diagnosis Date   Hyperlipidemia    Hypertension    Thyroid disease     Past Surgical History:  Procedure Laterality Date   ABDOMINAL HYSTERECTOMY     LEFT HEART CATH AND CORONARY ANGIOGRAPHY N/A 06/28/2019   Procedure: LEFT HEART CATH AND CORONARY ANGIOGRAPHY;  Surgeon: Nelva Bush, MD;  Location: [facility];  Service: Cardiovascular;  Laterality: N/A;     Current Outpatient Medications  Medication Sig Dispense  Refill   aspirin EC 81 MG EC tablet Take 1 tablet (81 mg total) by mouth daily. 30 tablet 0   bisoprolol (ZEBETA) 5 MG tablet Take 0.5 tablets (2.5 mg total) by mouth daily. 30 tablet 0   clopidogrel (PLAVIX) 75 MG tablet Take 1 tablet (75 mg total) by mouth daily with breakfast. 30 tablet 0   estrogens, conjugated, (PREMARIN) 0.625 MG tablet Take 0.625 mg by mouth every morning.     famotidine (PEPCID) 20 MG tablet Take 1 tablet (20 mg total) by mouth daily. (Patient taking differently: Take 20 mg by mouth every morning. ) 30 tablet 0   furosemide (LASIX) 20 MG tablet Take 1 tablet (20 mg total) by mouth daily. 30 tablet 0   isosorbide mononitrate (IMDUR) 30 MG 24 hr tablet Take 0.5 tablets (15 mg total) by mouth daily. 30 tablet 0   levothyroxine (SYNTHROID) 125 MCG tablet Take 125 mcg by mouth every morning.     rosuvastatin (CRESTOR) 20 MG tablet Take 20 mg by mouth every morning.     spironolactone (ALDACTONE) 25 MG tablet Take 0.5 tablets (12.5 mg total) by mouth daily. 30 tablet 0   traMADol (ULTRAM) 50 MG tablet Take 50 mg by mouth daily as needed (pain).     zolpidem (AMBIEN) 10 MG tablet Take 10 mg by mouth at bedtime as needed for sleep.      No current facility-administered medications for this visit.     Allergies:   Brisdelle [paroxetine mesylate],  Bee venom, and Hydrocodone    Social History:  The patient  reports that she has been smoking. She has a 48.00 pack-year smoking history. She has never used smokeless tobacco. She reports previous alcohol use. She reports that she does not use drugs.   Family History:  The patient's ***family history includes CAD in her sister; Diabetes in her mother; Heart disease in her father.    ROS:  General:no colds or fevers, no weight changes Skin:no rashes or ulcers HEENT:no blurred vision, no congestion CV:see HPI PUL:see HPI GI:no diarrhea constipation or melena, no indigestion GU:no hematuria, no dysuria MS:no joint  pain, no claudication Neuro:no syncope, no lightheadedness Endo:no diabetes, no thyroid disease Wt Readings from Last 3 Encounters:  06/29/19 106 lb 6.4 oz (48.3 kg)  03/11/19 130 lb (59 kg)  11/23/18 123 lb 4 oz (55.9 kg)     PHYSICAL EXAM: VS:  There were no vitals taken for this visit. , BMI There is no height or weight on file to calculate BMI. General:Pleasant affect, NAD Skin:Warm and dry, brisk capillary refill HEENT:normocephalic, sclera clear, mucus membranes moist Neck:supple, no JVD, no bruits  Heart:S1S2 RRR without murmur, gallup, rub or click Lungs:clear without rales, rhonchi, or wheezes JXB:JYNWAbd:soft, non tender, + BS, do not palpate liver spleen or masses Ext:no lower ext edema, 2+ pedal pulses, 2+ radial pulses Neuro:alert and oriented, MAE, follows commands, + facial symmetry    EKG:  EKG is ordered today. The ekg ordered today demonstrates ***   Recent Labs: 06/25/2019: ALT <5; B Natriuretic Peptide 649.0; TSH 7.231 06/29/2019: BUN 31; Creatinine, Ser 0.82; Hemoglobin 12.7; Platelets 359; Potassium 4.1; Sodium 136    Lipid Panel    Component Value Date/Time   CHOL 163 06/28/2019 0353   TRIG 334 (H) 06/28/2019 0353   HDL 58 06/28/2019 0353   CHOLHDL 2.8 06/28/2019 0353   VLDL 67 (H) 06/28/2019 0353   LDLCALC 38 06/28/2019 0353       Other studies Reviewed: Additional studies/ records that were reviewed today include: ***.   ASSESSMENT AND PLAN:  1.  ***   Current medicines are reviewed with the patient today.  The patient Has no concerns regarding medicines.  The following changes have been made:  See above Labs/ tests ordered today include:see above  Disposition:   FU:  see above  Signed, Nada BoozerLaura Refugia Laneve, NP  07/13/2019 2:44 PM    [facility] [address], [place], [place]  [address]/ [address] [place], [place] Phone: [phone]; Fax: [phone]  [phone]

## 2019-07-14 ENCOUNTER — Ambulatory Visit: Payer: Medicaid Other | Admitting: Cardiology

## 2019-07-14 ENCOUNTER — Ambulatory Visit (INDEPENDENT_AMBULATORY_CARE_PROVIDER_SITE_OTHER): Payer: Medicaid Other

## 2019-07-14 ENCOUNTER — Other Ambulatory Visit: Payer: Self-pay

## 2019-07-14 DIAGNOSIS — I499 Cardiac arrhythmia, unspecified: Secondary | ICD-10-CM | POA: Diagnosis not present

## 2019-07-14 DIAGNOSIS — I63019 Cerebral infarction due to thrombosis of unspecified vertebral artery: Secondary | ICD-10-CM | POA: Diagnosis not present

## 2019-07-14 NOTE — Patient Outreach (Signed)
Annette Morrison) Care Management  07/14/2019  Annette Morrison [birth year] [id number]  EMMI: stroke red alert Referral date: 07/13/19 Referral reason: smoked or been around smoking: yes Insurance:  medicaid Day # 6  Telephone call to patient regarding EMMI stroke red alert. HIPAA verified. RNCM explained reason for call.  Patient states she is still smoking about 1 pack per day. RNCM  Declined smoking cessation.  Patient states she has follow up with her cardiologist , Dr. Domenic Polite today.She reports she saw her primary MD on 07/06/19. Patient states she has transportation to her appointments. Patient states she is taking her medication as prescribed.  RNCM completed medication review with patient. RNCM discussed signs/ symptom of bleeding.  Discussed sign/symptoms of congestive heart failure. Patient states she does not have a scale but will purchase one. RNCM advised patient to weigh and record weights daily.  RNCM offered to send patient EMMI education material on stroke, heart failure, COPD, smoking cessation. Patient verbally agreed. Patient denies any new symptoms. Patient states stroke symptoms she had have resolved.  RNCM offered to follow up with patient within 1 week.  Patient verbally agreed.  RNCM advised patient to notify MD of any changes in condition prior to scheduled appointment. RNCM verified patient aware of 911 services for urgent/ emergent needs.  PLAN:  RNCM will follow up with patient within 1 week.  RNCM will send patient EMMI education material as discussed.   Quinn Plowman RN,BSN,CCM [facility] Telephonic  [phone]

## 2019-07-14 NOTE — Telephone Encounter (Signed)
This encounter was created in error - please disregard.

## 2019-07-15 ENCOUNTER — Other Ambulatory Visit: Payer: Self-pay

## 2019-07-22 ENCOUNTER — Other Ambulatory Visit: Payer: Self-pay

## 2019-07-22 NOTE — Patient Outreach (Signed)
[facility]) Care Management  07/22/2019  Kosha Jaquith September 04, 1956 [id number]  EMMI: stroke red alert Referral date: 07/13/19 Referral reason: smoked or been around smoking: yes Insurance:  medicaid Day # 6  Telephone call to patient regarding EMMI stroke follow up. HIPAA verified. Patient states she has recently developed a sore throat, headache and chest congestion. Patient denies any shortness of breath and / or swelling in extremities.  Patient states she is scheduled to see her doctor on tomorrow but does not want to go since she is sick.  RNCM advised patient to call her primary MD office today to inform them of her symptoms and allow her doctor to advise her.  Patient verbalized agreement. RNCM inquired if patient has received the [facility] EMMI education articles in the mail.  Patient states she has received and reviewed the information.  RNCM discussed smoking cessation and heart failure symptoms/ action plan with patient.  Patient verbalized understanding.  RNCM offered to follow up with patient regarding her symptoms and outcome of primary MD call.  Patient verbalized agreement.   PLAN; RNCM will follow up with patient within 3 business days.   Quinn Plowman RN,BSN,CCM [facility] Telephonic  [phone]

## 2019-07-26 ENCOUNTER — Other Ambulatory Visit: Payer: Self-pay

## 2019-07-26 ENCOUNTER — Ambulatory Visit: Payer: Self-pay

## 2019-07-26 NOTE — Patient Outreach (Signed)
[facility]) Care Management  07/26/2019  [name] 29-Dec-1955 [id number]  EMMI:stroke red alert Referral date:07/13/19 Referral reason:smoked or been around smoking: yes Insurance: medicaid Day #6  Telephone call to patient regarding EMMI stroke red alert. HIPAA verified. Patient states she contacted her doctors office and a tele health visit was completed with her primary MD. Patient states she primary MD prescribed her amoxicillin which she has been taking.  Patient states, " I feel so much better."  Patient reports she continues to have a cough and phlegm production. RNCM advised patient to take her antibiotic until completion.  Advised patient to contact her doctor for any further non emergent symptoms. Patient verbalized understanding.  Patient reports she has a follow up visit with her primary MD on August 05, 2019.  Patient denies further concerns at this time.   RNCM verified patient aware of 911 services for urgent/ emergent needs.  PLAN;  RNCM will close case due to patient being assessed and having no further needs.   Quinn Plowman RN,BSN,CCM [facility] Telephonic  [phone]

## 2019-08-04 ENCOUNTER — Ambulatory Visit: Payer: Medicaid Other | Admitting: Student

## 2019-08-12 ENCOUNTER — Inpatient Hospital Stay: Payer: Medicaid Other | Admitting: Adult Health

## 2019-08-19 ENCOUNTER — Telehealth: Payer: Self-pay | Admitting: *Deleted

## 2019-08-19 NOTE — Telephone Encounter (Signed)
-----   Message from [place], [place] sent at 08/19/2019 12:38 PM EDT ----- No evidence of afib or pause. She needs follow up with Dr. Domenic Polite or APP.

## 2019-08-19 NOTE — Telephone Encounter (Signed)
Call placed to pt re: monitor results, left a message for pt to call back.  

## 2019-08-20 NOTE — Telephone Encounter (Signed)
2nd attempt to reach pt re: monitor results.  Left a message for pt to call back.

## 2019-08-25 ENCOUNTER — Encounter: Payer: Self-pay | Admitting: *Deleted

## 2019-08-25 NOTE — Telephone Encounter (Signed)
3rd attempt to reach pt re: monitor results.   Left a message for pt to call office and mailed letter.

## 2020-07-17 IMAGING — CT CT ABDOMEN AND PELVIS WITH CONTRAST
3 of 5 series · 10 of 46 positions shown, 17 images · IV contrast (Isovue)
Comparison: None.

CLINICAL DATA: Generalized abdominal pain

EXAM:
CT ABDOMEN AND PELVIS WITH CONTRAST
TECHNIQUE: Multidetector CT imaging of the abdomen and pelvis was performed
using the standard protocol following bolus administration of
intravenous contrast.
CONTRAST:  100mL OMNIPAQUE IOHEXOL 300 MG/ML  SOLN

[Series 2: axial st · axial · 0.80mm/px · z∈[-426,-132]mm · 6 of 83 slices shown, 11 images]
[im 12/83  soft-tissue]
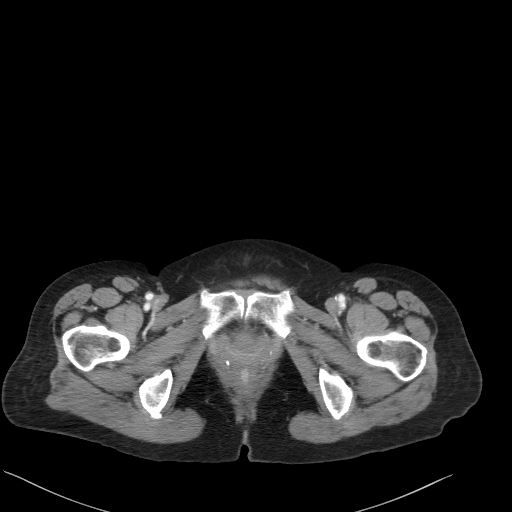
[im 12/83  bone]
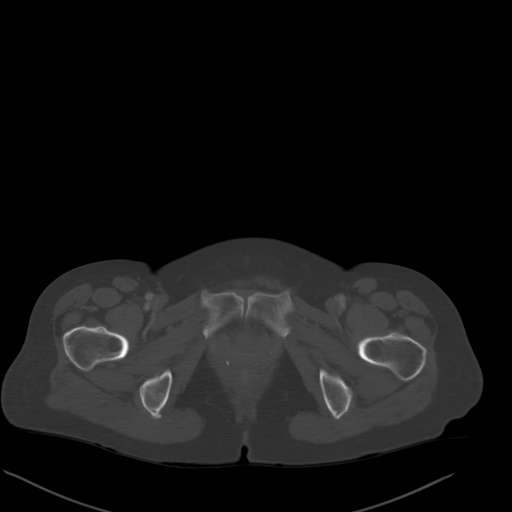
[im 24/83  soft-tissue]
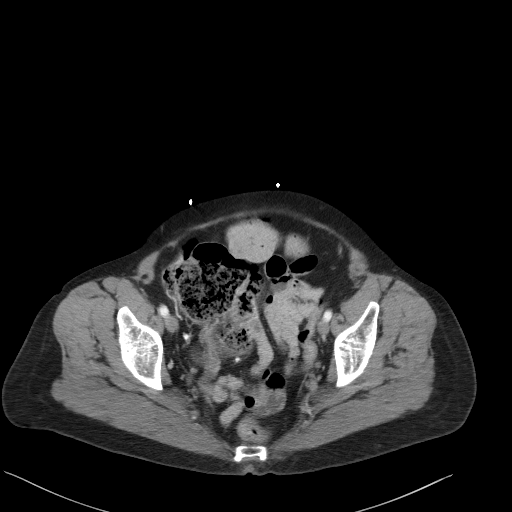
[im 36/83  soft-tissue]
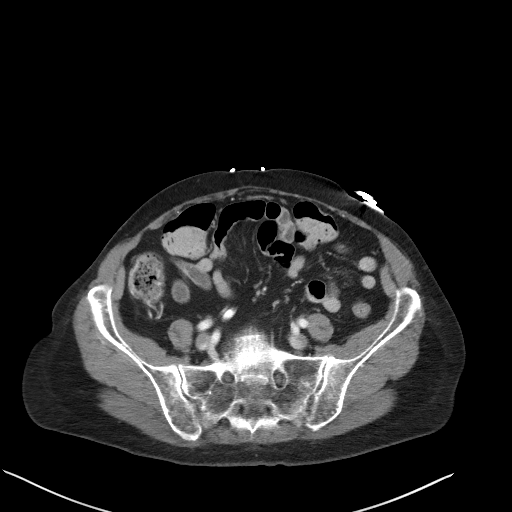
[im 36/83  lung]
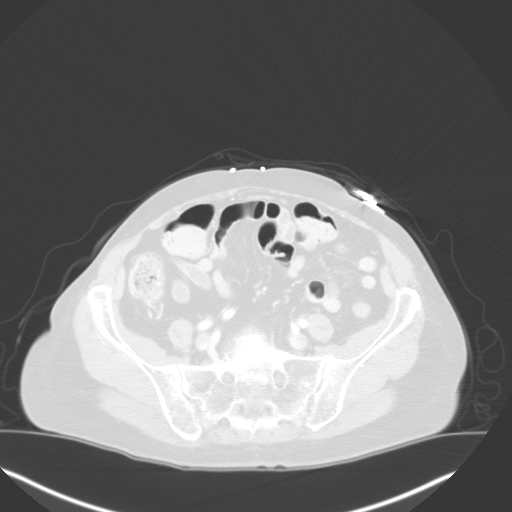
[im 47/83  soft-tissue]
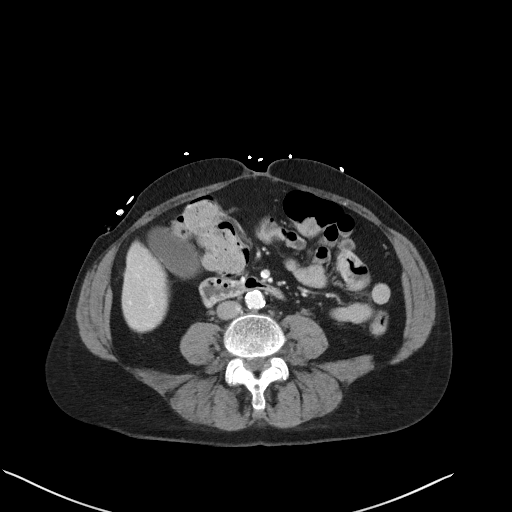
[im 47/83  lung]
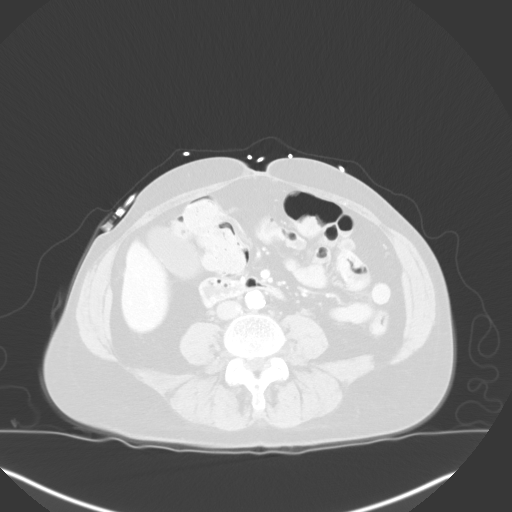
[im 59/83  soft-tissue]
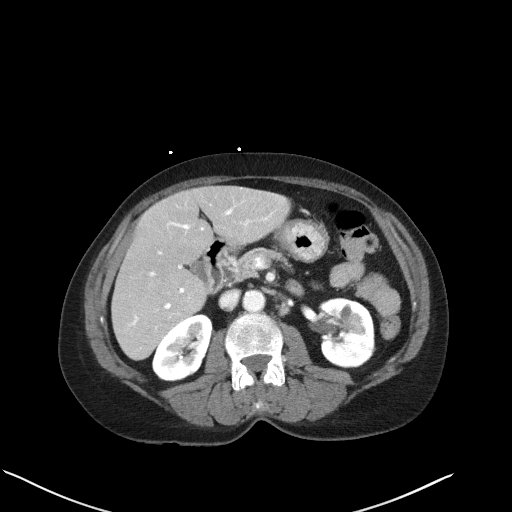
[im 59/83  lung]
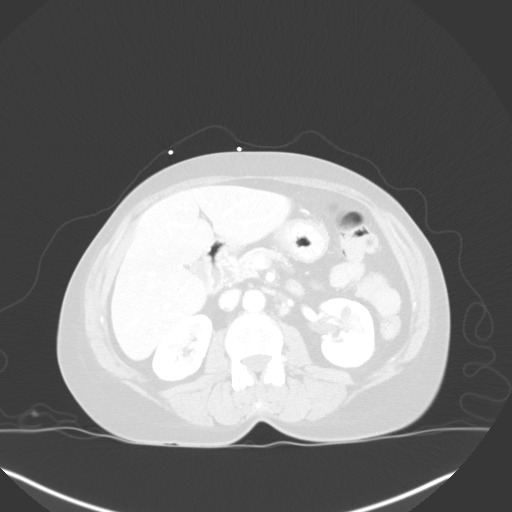
[im 71/83  soft-tissue]
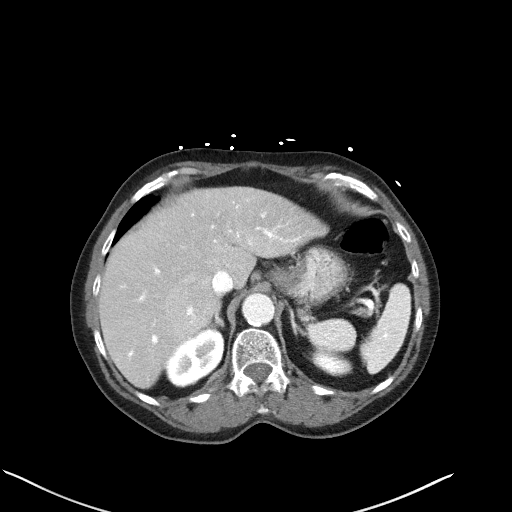
[im 71/83  lung]
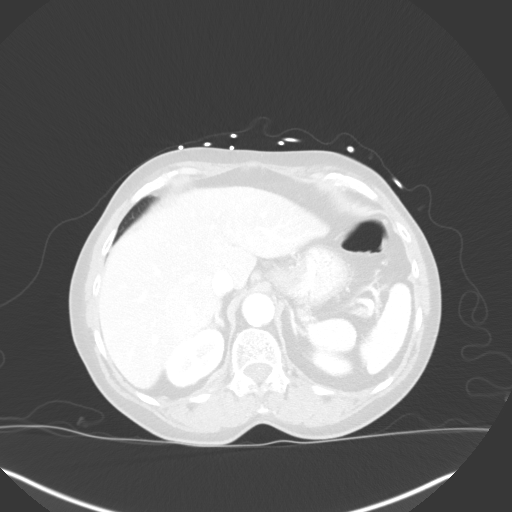

[Series 5: coronal st · coronal · 0.72mm/px · 3 of 82 slices shown, 4 images]
[im 28/82  soft-tissue]
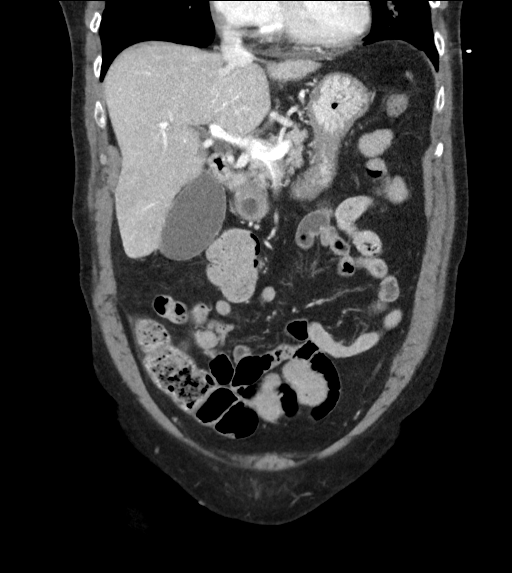
[im 37/82  soft-tissue]
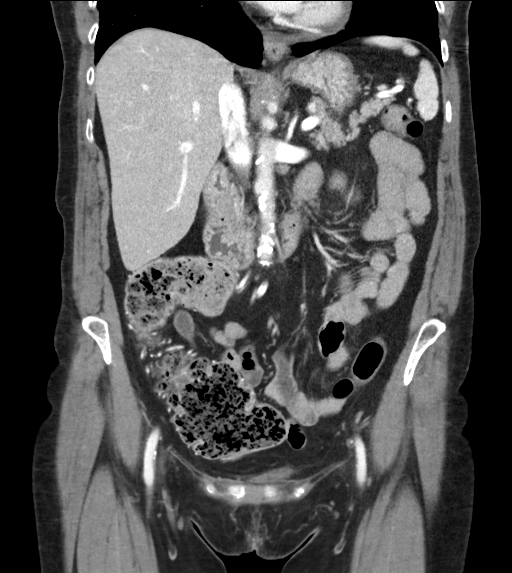
[im 37/82  bone]
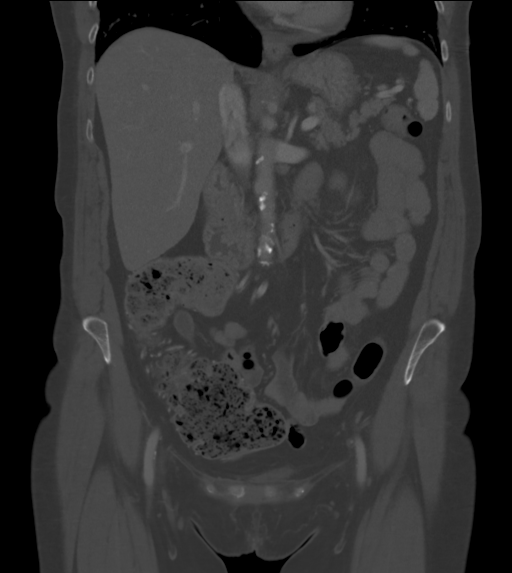
[im 46/82  soft-tissue]
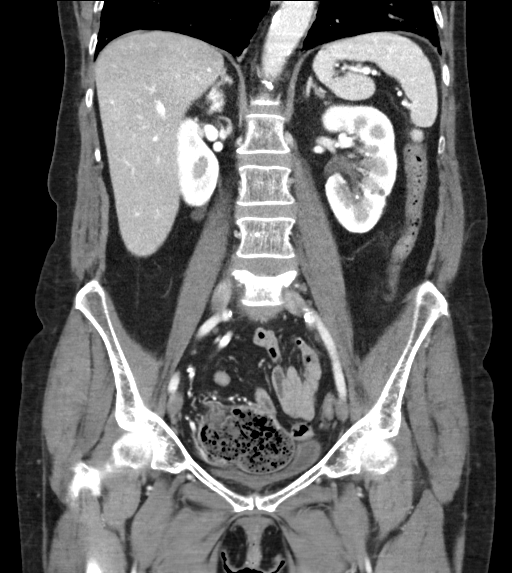

[Series 6: sagittal st · sagittal · 0.51mm/px · 1 of 117 slices shown, 2 images]
[im 39/117  soft-tissue]
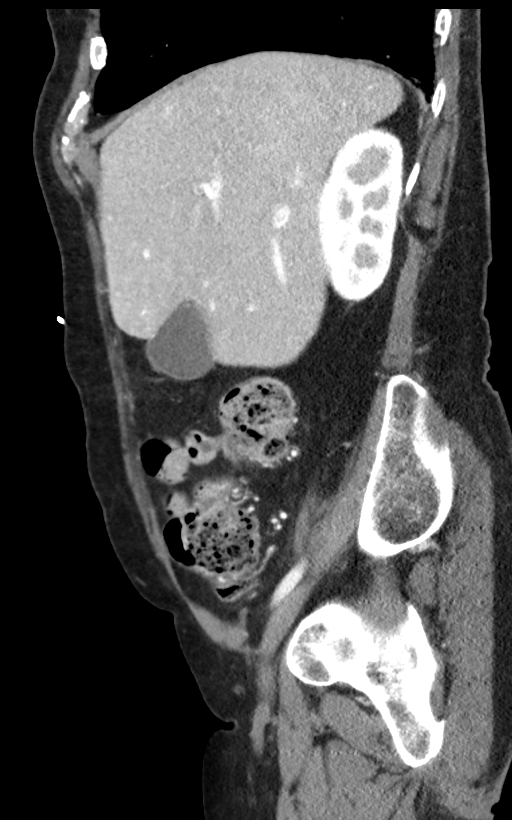
[im 39/117  bone]
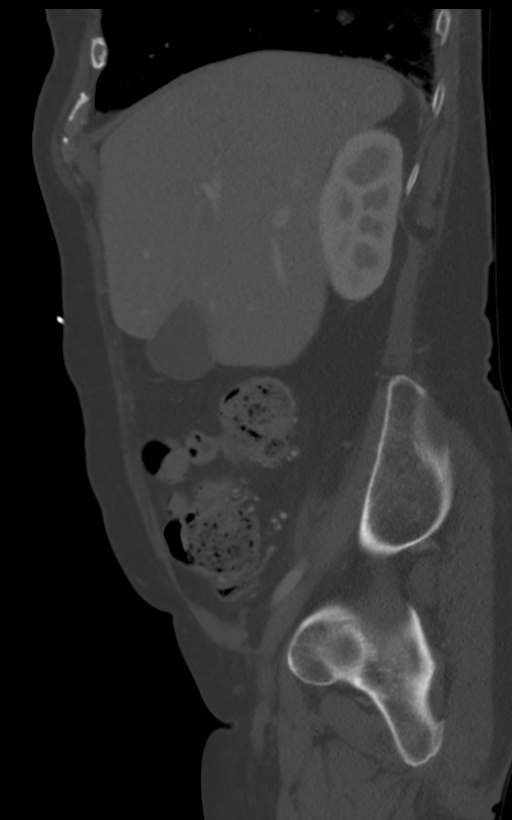

[10 of 46 positions shown; findings below may reference images not displayed]

FINDINGS: Lower chest: Airspace opacity in the lingula could reflect
atelectasis, scarring or infiltrate/pneumonia. Heart is normal size.
No effusions.

Hepatobiliary: No focal hepatic abnormality. Gallbladder
unremarkable.

Pancreas: No focal abnormality or ductal dilatation.

Spleen: No focal abnormality.  Normal size.

Adrenals/Urinary Tract: No focal abnormality. Normal size. Small
nonobstructing stones in the lower pole of both kidneys and upper
pole of the left kidney. No ureteral stones or hydronephrosis.
Urinary bladder is decompressed. Adrenal glands unremarkable.

Stomach/Bowel: The distal stomach wall and proximal duodenum appear
mildly thickened raising the possibility of gastritis. Small bowel
decompressed. Moderate stool burden in the right colon. Colon
otherwise unremarkable.

Vascular/Lymphatic: Aortic atherosclerosis. No enlarged abdominal or
pelvic lymph nodes.

Reproductive: Prior hysterectomy.  No adnexal masses.

Other: No free fluid or free air.

Musculoskeletal: Moderate compression deformity through the superior
endplate of L5, age indeterminate. Probable slight compression
deformity at T12 superior endplate as well.
IMPRESSION: Airspace disease in the lingula could reflect atelectasis, scarring
or pneumonia.

Bilateral nephrolithiasis.  No hydronephrosis.

Distal gastric wall and proximal duodenal wall appear thickened
suggesting gastritis.

Age-indeterminate compression deformities through the superior
endplate of L5 and superior endplate of T12.

## 2020-10-31 IMAGING — CR PORTABLE CHEST - 1 VIEW
1 series · 1 of 1 positions shown · non-contrast
Comparison: Radiograph March 11, 2019.

CLINICAL DATA: Shortness of breath, fever, cough.

EXAM:
PORTABLE CHEST 1 VIEW

[portable]
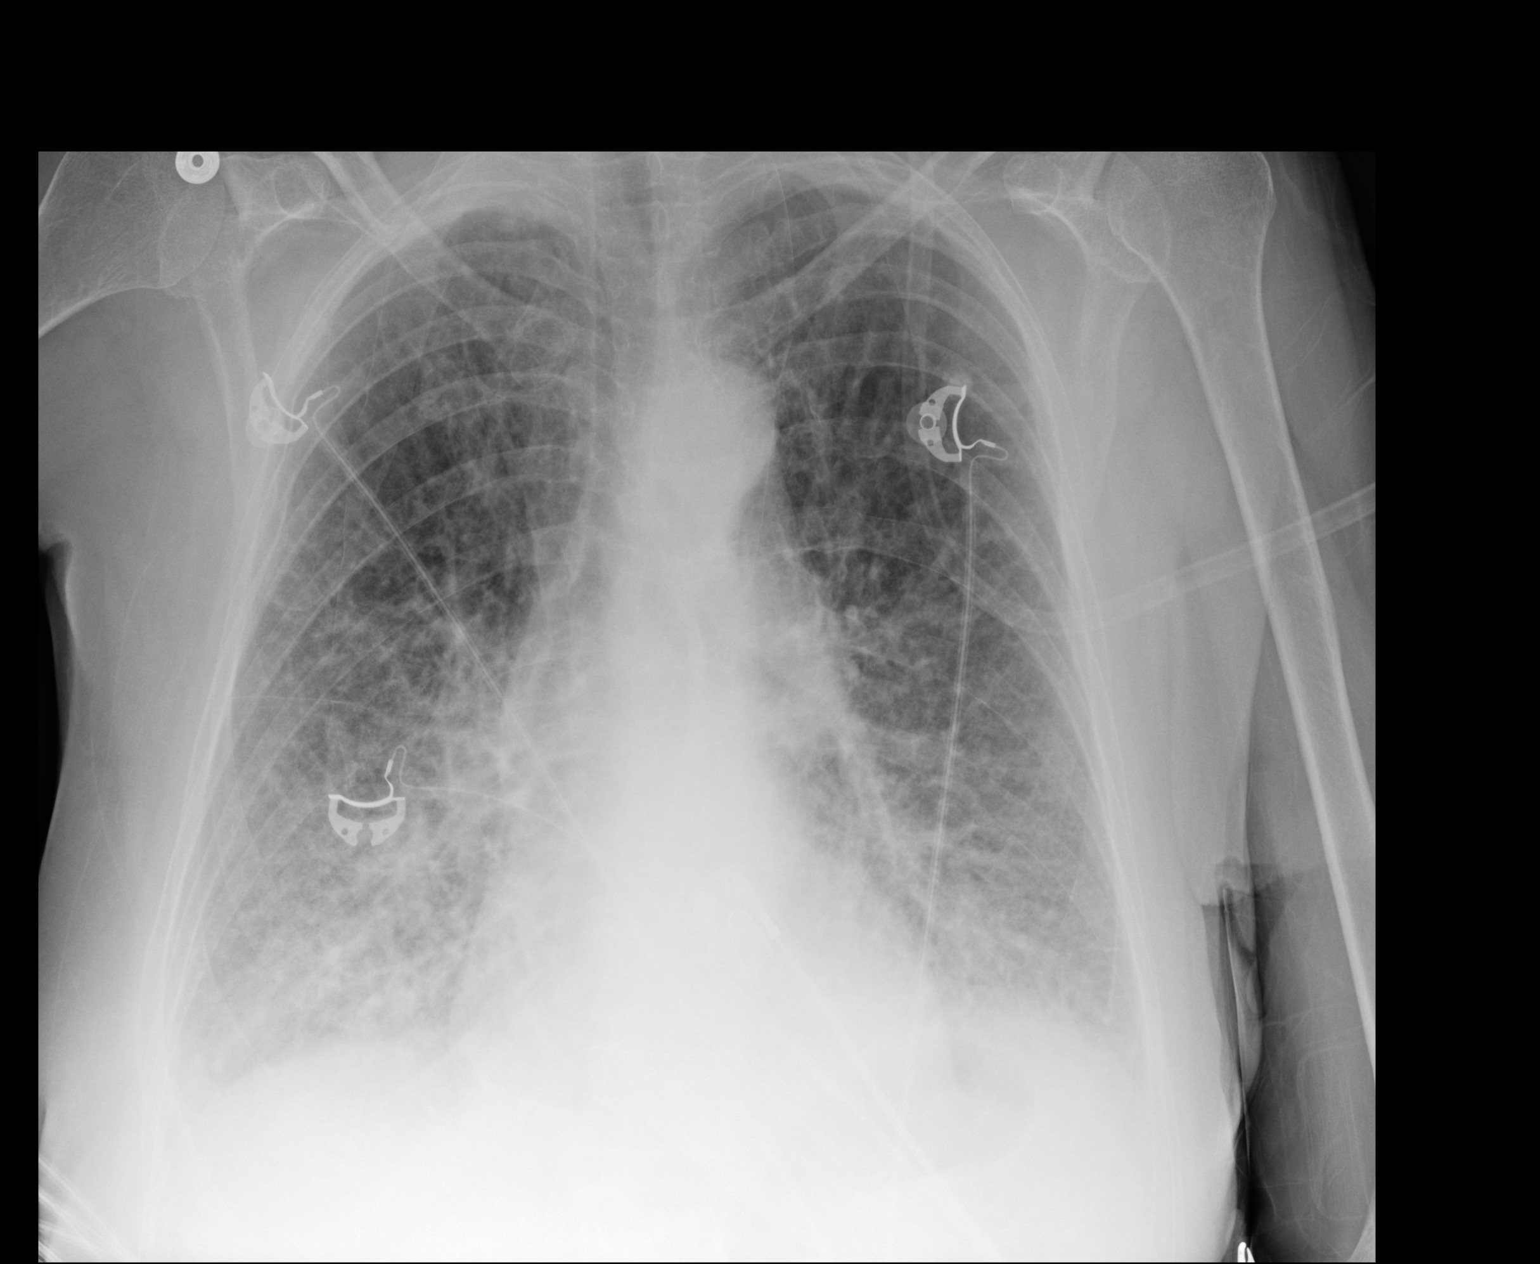

[1 of 1 positions shown; findings below may reference images not displayed]

FINDINGS: The heart size and mediastinal contours are within normal limits. No
pneumothorax is noted. Increased diffuse interstitial densities are
noted throughout both lungs consistent with pulmonary edema. Minimal
pleural effusions are noted. The visualized skeletal structures are
unremarkable.
IMPRESSION: Findings most consistent with bilateral pulmonary edema and minimal
pleural effusions.

## 2022-07-12 ENCOUNTER — Ambulatory Visit
Admission: EM | Admit: 2022-07-12 | Discharge: 2022-07-12 | Disposition: A | Discharge status: Home / Self Care | Payer: Medicare Other | Attending: Emergency Medicine | Admitting: Emergency Medicine

## 2022-07-12 ENCOUNTER — Encounter: Payer: Self-pay | Admitting: Emergency Medicine

## 2022-07-12 DIAGNOSIS — M545 Low back pain, unspecified: Secondary | ICD-10-CM

## 2022-07-12 MED ORDER — TIZANIDINE HCL 4 MG PO TABS: 4.0000 mg | ORAL_TABLET | Freq: Four times a day (QID) | ORAL | 0 refills | Status: DC | PRN

## 2022-07-12 MED ORDER — MELOXICAM 15 MG PO TABS: 15.0000 mg | ORAL_TABLET | Freq: Every day | ORAL | 0 refills | Status: AC

## 2022-07-12 NOTE — ED Provider Notes (Signed)
Annette Morrison - [facility]   MRN: [id number] DOB: [birth year]  Subjective:   Chief Complaint; No chief complaint on file.   Annette Morrison is a 66 y.o. female presenting for Service: 07/12/2022  2:40 PM   Signed   Back pain x 2 weeks.  States it hurts to sit up and try to get up.  Has been taking oxycodone for pain.  States she has been doing a lot of house cleaning and thinks this may be the reason for her pain.  Has also used icy hot with some relief      No current facility-administered medications for this encounter.  Current Outpatient Medications:    meloxicam (MOBIC) 15 MG tablet, Take 1 tablet (15 mg total) by mouth daily., Disp: 30 tablet, Rfl: 0   tiZANidine (ZANAFLEX) 4 MG tablet, Take 1 tablet (4 mg total) by mouth every 6 (six) hours as needed for muscle spasms., Disp: 30 tablet, Rfl: 0   aspirin EC 81 MG EC tablet, Take 1 tablet (81 mg total) by mouth daily., Disp: 30 tablet, Rfl: 0   bisoprolol (ZEBETA) 5 MG tablet, Take 0.5 tablets (2.5 mg total) by mouth daily., Disp: 30 tablet, Rfl: 0   clopidogrel (PLAVIX) 75 MG tablet, Take 1 tablet (75 mg total) by mouth daily with breakfast., Disp: 30 tablet, Rfl: 0   estrogens, conjugated, (PREMARIN) 0.625 MG tablet, Take 0.625 mg by mouth every morning., Disp: , Rfl:    famotidine (PEPCID) 20 MG tablet, Take 1 tablet (20 mg total) by mouth daily. (Patient taking differently: Take 20 mg by mouth every morning. ), Disp: 30 tablet, Rfl: 0   furosemide (LASIX) 20 MG tablet, Take 1 tablet (20 mg total) by mouth daily., Disp: 30 tablet, Rfl: 0   isosorbide mononitrate (IMDUR) 30 MG 24 hr tablet, Take 0.5 tablets (15 mg total) by mouth daily., Disp: 30 tablet, Rfl: 0   levothyroxine (SYNTHROID) 125 MCG tablet, Take 125 mcg by mouth every morning., Disp: , Rfl:    rosuvastatin (CRESTOR) 20 MG tablet, Take 20 mg by mouth every morning., Disp: , Rfl:    spironolactone (ALDACTONE) 25 MG tablet, Take 0.5 tablets (12.5 mg total) by mouth  daily., Disp: 30 tablet, Rfl: 0   traMADol (ULTRAM) 50 MG tablet, Take 50 mg by mouth daily as needed (pain)., Disp: , Rfl:    zolpidem (AMBIEN) 10 MG tablet, Take 10 mg by mouth at bedtime as needed for sleep. , Disp: , Rfl:    Allergies  Allergen Reactions   Brisdelle [Paroxetine Mesylate] Other (See Comments)    seizure   Bee Venom Rash   Hydrocodone Rash    Past Medical History:  Diagnosis Date   Hyperlipidemia    Hypertension    Thyroid disease      Review of Systems  All other systems reviewed and are negative.    Objective:   Vitals: BP 114/75 (BP Location: Right Arm)   Pulse 78   Temp 98 F (36.7 C) (Oral)   Resp 16   SpO2 98%   Physical Exam Vitals and nursing note reviewed.  Constitutional:      General: She is not in acute distress.    Appearance: Normal appearance. She is normal weight. She is not ill-appearing or toxic-appearing.  HENT:     Head: Normocephalic and atraumatic.     Right Ear: External ear normal.     Left Ear: External ear normal.  Eyes:     Conjunctiva/sclera: Conjunctivae  normal.  Cardiovascular:     Rate and Rhythm: Normal rate and regular rhythm.  Pulmonary:     Effort: Pulmonary effort is normal.  Musculoskeletal:     Comments: Left lower back  and diffuse thoracic pain tenderness to palpation.  No midline spine pain.  No evidence of cauda equina Limited range of motion due to pain.  No NVD D  Skin:    General: Skin is warm and dry.     Capillary Refill: Capillary refill takes less than 2 seconds.  Neurological:     Mental Status: She is alert.     Sensory: Sensory deficit present.     Motor: No weakness.     Coordination: Coordination abnormal.  Psychiatric:        Mood and Affect: Mood normal.        Behavior: Behavior normal.     No results found for this or any previous visit (from the past 24 hour(s)).  No results found.     Assessment and Plan :   1. Acute left-sided low back pain without sciatica      Meds ordered this encounter  Medications   tiZANidine (ZANAFLEX) 4 MG tablet    Sig: Take 1 tablet (4 mg total) by mouth every 6 (six) hours as needed for muscle spasms.    Dispense:  30 tablet    Refill:  0    Order Specific Question:   Supervising Provider    Answer:   Merrilee Jansky [[id number]]   meloxicam (MOBIC) 15 MG tablet    Sig: Take 1 tablet (15 mg total) by mouth daily.    Dispense:  30 tablet    Refill:  0    Order Specific Question:   Supervising Provider    Answer:   Merrilee Jansky [[id number]]    MDM:  Annette Morrison is a 66 y.o. female presenting for acute on chronic back pain after doing a lot of housework.  On exam there is diffuse tenderness to the back muscles mostly to the left lower back.  There was no midline spine tenderness, signs of cauda equina or neurologic deficit distal extremities.  I prescribed tizanidine to be used cautiously and not with any other narcotic.  I also prescribed Mobic for anti-inflammatory.  They will continue to use over-the-counter IcyHot or Biofreeze and moist heat.  He will return for new or worsening symptoms.  I discussed todays findings, treatment plan, follow up and return instructions. Questions were answered. Patient/representative stated understanding of the instructions and patient is stable for discharge. Annette Conger FNP-C MSN     Jone Baseman, NP 07/12/22 [phone]

## 2022-07-12 NOTE — Discharge Instructions (Signed)
Use moist heat and topical IcyHot with massage as you have already been doing.  Try tizanidine along with Mobic for anti-inflammation.  Return for any new or worsening symptoms at any time for reevaluation

## 2022-07-12 NOTE — ED Triage Notes (Signed)
Back pain x 2 weeks.  States it hurts to sit up and try to get up.  Has been taking oxycodone for pain.  States she has been doing a lot of house cleaning and thinks this may be the reason for her pain.  Has also used icy hot with some relief.

## 2022-07-15 ENCOUNTER — Ambulatory Visit
Admission: EM | Admit: 2022-07-15 | Discharge: 2022-07-15 | Disposition: A | Discharge status: Home / Self Care | Payer: Medicare Other | Attending: Nurse Practitioner | Admitting: Nurse Practitioner

## 2022-07-15 ENCOUNTER — Ambulatory Visit (INDEPENDENT_AMBULATORY_CARE_PROVIDER_SITE_OTHER): Payer: Medicare Other

## 2022-07-15 ENCOUNTER — Ambulatory Visit: Payer: Self-pay

## 2022-07-15 DIAGNOSIS — M545 Low back pain, unspecified: Secondary | ICD-10-CM

## 2022-07-15 MED ORDER — LIDOCAINE 4 % EX PTCH: 1.0000 | MEDICATED_PATCH | CUTANEOUS | 0 refills | Status: DC

## 2022-07-15 MED ORDER — KETOROLAC TROMETHAMINE 30 MG/ML IJ SOLN
30.0000 mg | Freq: Once | INTRAMUSCULAR | Status: AC
  Administered 2022-07-15: 30 mg via INTRAMUSCULAR

## 2022-07-15 MED ORDER — SENNOSIDES-DOCUSATE SODIUM 8.6-50 MG PO TABS: 2.0000 | ORAL_TABLET | Freq: Every day | ORAL | 0 refills | Status: DC

## 2022-07-15 MED ORDER — DEXAMETHASONE SODIUM PHOSPHATE 10 MG/ML IJ SOLN
10.0000 mg | INTRAMUSCULAR | Status: AC
  Administered 2022-07-15: 10 mg via INTRAMUSCULAR

## 2022-07-15 NOTE — Discharge Instructions (Addendum)
As discussed, your x-ray shows you have a compression fracture of L1 that is new since your previous x-rays in 2020.  X-rays also show constipation and renal stones. Continue the meloxicam and tizanidine you were previously prescribed.  As discussed, do not take the oxycodone and tizanidine together as this will cause sedation. Increase fluids and increase the fruits and vegetables in your diet to help with constipation. May continue use of heat as needed for back pain.  Apply for 20 minutes, remove for 1 hour, then repeat as much as possible. Recommend following up with your primary care physician regarding the findings of the x-ray showing the kidney stones and constipation.  Also recommend following up with orthopedics or spine specialist regarding the compression fractures in your back. Go to the emergency department immediately if you develop an inability to walk, loss of bowel or bladder function, numbness, tingling, or weakness of your legs or feet, or other concerns.

## 2022-07-15 NOTE — ED Triage Notes (Signed)
Pt reports left sided back pain x 2 weeks. Reports she was prescribed Meloxicam and tizanidine on 07/12/2022 without relief.

## 2022-07-15 NOTE — ED Provider Notes (Signed)
[facility]    CSN: [id number] Arrival date & time: 07/15/22  1245      History   Chief Complaint Chief Complaint  Patient presents with   Appointment    1300   Back Pain    HPI Annette Morrison is a 66 y.o. female.   The history is provided by the patient.   The patient presents with a 2-week history of left-sided low back pain.  Patient was seen in this clinic on 07/12/2022 and prescribed meloxicam and tizanidine.  Patient states she has been taking medication without much relief.  She states that the pain has been persistent over the past several days.  She has tenderness to the left lower back that radiates into the left buttock.  She denies numbness, tingling, weakness, loss of bowel or bladder function, urinary symptoms, or change in bowel habits.  Patient states that she normally takes oxycodone at baseline for pain.  She states that pain worsens when she is going from a sitting to a standing position.  Past Medical History:  Diagnosis Date   Hyperlipidemia    Hypertension    Thyroid disease     Patient Active Problem List   Diagnosis Date Noted   Systolic congestive heart failure (HCC)    Cerebral embolism with cerebral infarction 06/28/2019   Non-ST elevation (NSTEMI) myocardial infarction [facility])    Acute respiratory failure (HCC) 06/25/2019   COPD exacerbation (HCC) 06/25/2019   Acute systolic CHF (congestive heart failure) (HCC) 06/25/2019   Slurred speech 06/25/2019   Hyperlipidemia 08/26/2018   Hypothyroidism 08/26/2018   Essential hypertension 07/29/2018   Mammogram declined 07/29/2018   Tobacco use disorder 07/29/2018    Past Surgical History:  Procedure Laterality Date   ABDOMINAL HYSTERECTOMY     LEFT HEART CATH AND CORONARY ANGIOGRAPHY N/A 06/28/2019   Procedure: LEFT HEART CATH AND CORONARY ANGIOGRAPHY;  Surgeon: Yvonne Kendall, MD;  Location: [facility];  Service: Cardiovascular;  Laterality: N/A;    OB History   No obstetric  history on file.      Home Medications    Prior to Admission medications   Medication Sig Start Date End Date Taking? Authorizing Provider  lidocaine 4 % Place 1 patch onto the skin daily. 07/15/22  Yes Yuridiana Formanek-Warren, Sadie Haber, NP  senna-docusate (SENOKOT-S) 8.6-50 MG tablet Take 2 tablets by mouth at bedtime. 07/15/22  Yes Pesach Frisch-Warren, Sadie Haber, NP  aspirin EC 81 MG EC tablet Take 1 tablet (81 mg total) by mouth daily. 06/30/19   Zannie Cove, MD  bisoprolol (ZEBETA) 5 MG tablet Take 0.5 tablets (2.5 mg total) by mouth daily. 06/30/19   Zannie Cove, MD  clopidogrel (PLAVIX) 75 MG tablet Take 1 tablet (75 mg total) by mouth daily with breakfast. 06/30/19   Zannie Cove, MD  estrogens, conjugated, (PREMARIN) 0.625 MG tablet Take 0.625 mg by mouth every morning. 03/25/19   [provider]  famotidine (PEPCID) 20 MG tablet Take 1 tablet (20 mg total) by mouth daily. Patient taking differently: Take 20 mg by mouth every morning.  03/11/19   Terrilee Files, MD  furosemide (LASIX) 20 MG tablet Take 1 tablet (20 mg total) by mouth daily. 06/30/19   Zannie Cove, MD  isosorbide mononitrate (IMDUR) 30 MG 24 hr tablet Take 0.5 tablets (15 mg total) by mouth daily. 06/30/19   Zannie Cove, MD  levothyroxine (SYNTHROID) 125 MCG tablet Take 125 mcg by mouth every morning. 06/02/19   [provider]  meloxicam (  MOBIC) 15 MG tablet Take 1 tablet (15 mg total) by mouth daily. 07/12/22 08/11/22  Jone Baseman, NP  rosuvastatin (CRESTOR) 20 MG tablet Take 20 mg by mouth every morning. 04/22/19   [provider]  spironolactone (ALDACTONE) 25 MG tablet Take 0.5 tablets (12.5 mg total) by mouth daily. 06/30/19   Zannie Cove, MD  tiZANidine (ZANAFLEX) 4 MG tablet Take 1 tablet (4 mg total) by mouth every 6 (six) hours as needed for muscle spasms. 07/12/22   Jone Baseman, NP  traMADol (ULTRAM) 50 MG tablet Take 50 mg by mouth daily as needed (pain).    [provider]  zolpidem (AMBIEN) 10 MG tablet Take 10 mg by mouth at bedtime as needed for sleep.  05/27/19   [provider]    Family History Family History  Problem Relation Age of Onset   Diabetes Mother    Heart disease Father        Died of MI at age 54   CAD Sister     Social History Social History   Tobacco Use   Smoking status: Every Day    Packs/day: 1.00    Years: 48.00    Total pack years: 48.00    Types: Cigarettes   Smokeless tobacco: Never  Vaping Use   Vaping Use: Never used  Substance Use Topics   Alcohol use: Not Currently   Drug use: Never     Allergies   Brisdelle [paroxetine mesylate], Bee venom, and Hydrocodone   Review of Systems Review of Systems Per HPI  Physical Exam Triage Vital Signs ED Triage Vitals [07/15/22 1314]  Enc Vitals Group     BP 126/79     Pulse Rate 78     Resp 18     Temp 98 F (36.7 C)     Temp Source Oral     SpO2 97 %     Weight      Height      Head Circumference      Peak Flow      Pain Score      Pain Loc      Pain Edu?      Excl. in GC?    No data found.  Updated Vital Signs BP 126/79 (BP Location: Right Arm)   Pulse 78   Temp 98 F (36.7 C) (Oral)   Resp 18   SpO2 97%   Visual Acuity Right Eye Distance:   Left Eye Distance:   Bilateral Distance:    Right Eye Near:   Left Eye Near:    Bilateral Near:     Physical Exam Vitals and nursing note reviewed.  Constitutional:      General: She is not in acute distress.    Appearance: Normal appearance.  HENT:     Head: Normocephalic.  Cardiovascular:     Rate and Rhythm: Regular rhythm.     Pulses: Normal pulses.     Heart sounds: Normal heart sounds.  Pulmonary:     Effort: Pulmonary effort is normal. No respiratory distress.     Breath sounds: No stridor. No wheezing, rhonchi or rales.  Chest:     Chest wall: No tenderness.  Abdominal:     General: Bowel sounds are normal.     Palpations: Abdomen is soft.  Musculoskeletal:      Lumbar back: Tenderness (L sciartic nerve) present. No swelling or deformity. Decreased range of motion.  Skin:    General: Skin is  warm and dry.  Neurological:     General: No focal deficit present.     Mental Status: She is alert and oriented to person, place, and time.  Psychiatric:        Mood and Affect: Mood normal.        Behavior: Behavior normal.      UC Treatments / Results  Labs (all labs ordered are listed, but only abnormal results are displayed) Labs Reviewed - No data to display  EKG   Radiology DG Lumbar Spine Complete  Result Date: 07/15/2022 CLINICAL DATA:  Back pain for 2 weeks, no injury EXAM: LUMBAR SPINE - COMPLETE 4+ VIEW COMPARISON:  CT abdomen and pelvis 03/11/2019 FINDINGS: Five non-rib-bearing lumbar vertebra. Diffuse osseous demineralization. Chronic superior endplate compression deformity of L5. New superior endplate compression fracture of L1 with 25% anterior height loss. Multilevel facet degenerative changes lower lumbar spine. No additional fracture, subluxation, or bone destruction. Mild broad-based dextroconvex thoracolumbar scoliosis. SI joints preserved. Atherosclerotic calcifications aorta. Increased stool throughout colon. Multiple BILATERAL renal calculi. IMPRESSION: Osseous demineralization with old L5 superior endplate compression fracture and age-indeterminate superior endplate compression fracture of L1, new since 03/11/2019. Increased stool throughout colon. Multiple BILATERAL renal calculi. Aortic Atherosclerosis (ICD10-I70.0). Electronically Signed   By: Ulyses Southward M.D.   On: 07/15/2022 13:48    Procedures Procedures (including critical care time)  Medications Ordered in UC Medications  dexamethasone (DECADRON) injection 10 mg (10 mg Intramuscular Given 07/15/22 1355)  ketorolac (TORADOL) 30 MG/ML injection 30 mg (30 mg Intramuscular Given 07/15/22 1355)    Initial Impression / Assessment and Plan / UC Course  I have reviewed the  triage vital signs and the nursing notes.  Pertinent labs & imaging results that were available during my care of the patient were reviewed by me and considered in my medical decision making (see chart for details).  Patient presents for complaints of left-sided low back pain that been present for the past 2 weeks.  Patient was seen in this clinic on 07/12/2022 and prescribed meloxicam and tizanidine for her symptoms.  Patient presents today denying any relief of her symptoms at this time.  On exam, patient has point tenderness to the left sciatic nerve.  She also has difficulty going from a sitting to a standing position.  Patient is ambulating with a walker.  There are no red flag symptoms noted on her exam today.  X-rays reveal demineralization of an old L5 compression fracture and a another compression fracture at L1, that is different from x-rays performed in 2020.  She also has moderate stool burden and multiple bilateral renal calculi.  Based on the x-ray findings, symptoms are all contributory most likely to the patient's symptoms.  Discussed the findings of the x-ray with the patient.  Patient was advised that she will need to follow-up with orthopedics or a spine specialist for further evaluation for her lumbar spine.  Also advised patient that I will start her on Senokot to help with her constipation.  With regard to the kidney stone seen, patient advised to follow-up with her primary care physician.  Patient advised to continue the meloxicam and tizanidine she was previously prescribed.  Patient advised to refrain from taking oxycodone with the tizanidine as it can be sedating.  Patient was also advised to continue supportive care recommendations such as the use of ice or heat.  Patient was given indications of when to go to the emergency department.  Patient verbalizes understanding.  All questions  were answered. Final Clinical Impressions(s) / UC Diagnoses   Final diagnoses:  Lumbar back pain      Discharge Instructions      As discussed, your x-ray shows you have a compression fracture of L1 that is new since your previous x-rays in 2020.  X-rays also show constipation and renal stones. Continue the meloxicam and tizanidine you were previously prescribed.  As discussed, do not take the oxycodone and tizanidine together as this will cause sedation. Increase fluids and increase the fruits and vegetables in your diet to help with constipation. May continue use of heat as needed for back pain.  Apply for 20 minutes, remove for 1 hour, then repeat as much as possible. Recommend following up with your primary care physician regarding the findings of the x-ray showing the kidney stones and constipation.  Also recommend following up with orthopedics or spine specialist regarding the compression fractures in your back. Go to the emergency department immediately if you develop an inability to walk, loss of bowel or bladder function, numbness, tingling, or weakness of your legs or feet, or other concerns.      ED Prescriptions     Medication Sig Dispense Auth. Provider   lidocaine 4 % Place 1 patch onto the skin daily. 15 patch Cordney Barstow-Warren, Sadie Haber, NP   senna-docusate (SENOKOT-S) 8.6-50 MG tablet Take 2 tablets by mouth at bedtime. 30 tablet Sallyanne Birkhead-Warren, Sadie Haber, NP      PDMP not reviewed this encounter.   Abran Cantor, NP 07/15/22 1409

## 2024-02-11 NOTE — Progress Notes (Signed)
 SUBJECTIVE   Patient ID: Annette Morrison is a 68 y.o. (DOB 1956/01/26) female  Chief Complaint  Patient presents with  . Follow-up    Pt presents today for a f/u on chronic care management.     HPI:  Patient is present with her husband Vinie -Her weight is stable today, she reports she has had an appetite and is doing well at this time  Hypertension Patient's current  medication regimen is: Atenolol 50 mg every day, Spironolactone  25 mg every day, and Furosemide  20 mg every day   Aortic Atherosclerosis Patient's current medication regimen is: Asprin 81 mg every day, Plavix  75 mg every day, and Imdur  30 mg every day   Hyperlipidemia Patient's current medication regimen is: Rosuvastatin  20 mg every day   Hypothyroidism Patient's current  medication regimen is: Levothyroxine  75 mcg every day   Insomnia Patient's current medication regimen is: Ambien 10 mg every day    OBJECTIVE   Allergies  Allergen Reactions  . Hydrocodone  Hives and Rash  . Bee Sting Kit Hives  . Hydrocodone -Acetaminophen  Itching  . Paroxetine Mesylate Other (See Comments)    seizures  . Venom-Honey Bee Hives, Itching and Rash    Current Outpatient Medications  Medication Sig Dispense Refill  . aspirin  (Bayer Low Dose Aspirin ) 81 mg EC tablet Take 81 mg by mouth once.    SABRA atenoloL (TENORMIN) 50 mg tablet Take 1 tablet (50 mg total) by mouth daily. 90 tablet 3  . clopidogreL  (PLAVIX ) 75 mg tablet Take 1 tablet (75 mg total) by mouth daily. 90 tablet 3  . famotidine  (PEPCID ) 20 mg tablet TAKE 1 TABLET(20 MG) BY MOUTH TWICE DAILY 180 tablet 3  . isosorbide  mononitrate (IMDUR ) 30 mg 24 hr tablet Take 1 tablet (30 mg total) by mouth daily. 90 tablet 3  . Percocet 10-325 mg per tablet Take 1 tablet 3 times a day by oral route as needed for pain for 30 days.    . rosuvastatin  (CRESTOR ) 20 mg tablet Take 1 tablet (20 mg total) by mouth daily. 90 tablet 3  . spironolactone  (ALDACTONE ) 25 mg tablet Take 1  tablet (25 mg total) by mouth daily. 90 tablet 3  . zolpidem (AMBIEN) 10 mg tablet Take 1 tablet (10 mg total) by mouth nightly as needed for sleep. 90 tablet 3  . furosemide  (LASIX ) 20 mg tablet Take 1 tablet (20 mg total) by mouth daily. 90 tablet 1  . levothyroxine  (SYNTHROID ) 75 mcg tablet Take 1 tablet (75 mcg total) by mouth daily. 90 tablet 3   No current facility-administered medications for this visit.    Patient Active Problem List  Diagnosis  . Pure hypercholesterolemia  . Chronic bilateral low back pain without sciatica  . Primary insomnia  . Hyperkeratosis of sole  . Seizures, transient (HCC)  . Hypothyroidism (acquired)  . DDD (degenerative disc disease), cervical  . 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 Post-menopause on HRT (hormone replacement therapy)  . Osteoporosis  . Nicotine  dependence  . History of colon polyps  . Family history of breast cancer  . Pain of left hip joint  . Essential hypertension  . Gastroesophageal reflux disease without esophagitis  . Acute respiratory failure, unspecified whether with hypoxia or hypercapnia    (CMD)  . Systolic congestive heart failure, unspecified HF chronicity    (CMD)  . COPD exacerbation    (CMD)  . Acute systolic CHF (congestive heart failure)    (CMD)  . Aortic atherosclerosis  Past Surgical History:  Procedure Laterality Date  . APPENDECTOMY     Procedure: APPENDECTOMY  . BLADDER SURGERY     Procedure: BLADDER SURGERY  . DENTAL SURGERY      Procedure: DENTAL SURGERY  . HEMORRHOID SURGERY     Procedure: HEMORRHOID SURGERY  . HYSTERECTOMY      Procedure: HYSTERECTOMY  . TUBAL LIGATION     Procedure: TUBAL LIGATION    Family History  Problem Relation Name Age of Onset  . Thyroid  cancer Mother    . Breast cancer Sister      BP 108/71 (BP Location: Left arm, Patient Position: Sitting)   Pulse 80   Temp 98.9 F (37.2 C) (Temporal)   Ht 1.62 m (5' 3.78)   Wt 36.9 kg (81 lb 6.4 oz)   SpO2 100%   BMI  14.07 kg/m   Review of Systems  Constitutional: Negative.   HENT: Negative.    Eyes: Negative.   Respiratory: Negative.    Endocrine: Negative.   Genitourinary: Negative.   Neurological: Negative.   All other systems reviewed and are negative.   Physical Exam Constitutional:      Appearance: Normal appearance. She is normal weight.  HENT:     Head: Normocephalic.     Right Ear: Tympanic membrane, ear canal and external ear normal.     Left Ear: Tympanic membrane, ear canal and external ear normal.     Nose: Nose normal.     Mouth/Throat:     Mouth: Mucous membranes are moist.     Pharynx: Oropharynx is clear.  Eyes:     Extraocular Movements: Extraocular movements intact.     Conjunctiva/sclera: Conjunctivae normal.     Pupils: Pupils are equal, round, and reactive to light.  Cardiovascular:     Rate and Rhythm: Normal rate and regular rhythm.     Pulses: Normal pulses.     Heart sounds: Normal heart sounds.  Pulmonary:     Effort: Pulmonary effort is normal.     Breath sounds: Normal breath sounds.  Abdominal:     General: Abdomen is flat. Bowel sounds are normal.  Musculoskeletal:        General: Normal range of motion.     Cervical back: Normal range of motion.  Skin:    General: Skin is warm.  Neurological:     General: No focal deficit present.     Mental Status: She is alert and oriented to person, place, and time.  Psychiatric:        Mood and Affect: Mood normal.        Behavior: Behavior normal.        Thought Content: Thought content normal.        Judgment: Judgment normal.       ASSESSMENT/PLAN   1. Essential hypertension (Primary) Continue Atenolol 50 mg every day, Spironolactone  25 mg every day, and Furosemide  20 mg every day  - furosemide  (LASIX ) 20 mg tablet; Take 1 tablet (20 mg total) by mouth daily.  Dispense: 90 tablet; Refill: 1 - Comprehensive Metabolic Panel  2. Acute systolic CHF (congestive heart failure)    (CMD) Continue Asprin  81 mg every day, Plavix  75 mg every day, and Imdur  30 mg every day  - CBC with Differential  3. Pure hypercholesterolemia Continue Rosuvastatin  20 mg every day  - Lipid Panel  4. Hypothyroidism (acquired) Continue Levothyroxine  75 mcg every day  - levothyroxine  (SYNTHROID ) 75 mcg tablet; Take 1 tablet (75 mcg  total) by mouth daily.  Dispense: 90 tablet; Refill: 3 - TSH  5. Primary insomnia Continue Ambien 10 mg every day    Return in about 3 months (around 05/12/2024).   Risks, benefits, and alternatives of the medication(s) and treatment plan(s) were discussed, and she expressed understanding. Plan follow up as discussed or as needed. No barriers to treatment identified in this visit.      This document serves as a record of services personally performed by Elvie Shirk, MD.  It was created on their behalf by Burnard Haff, CMA, a trained medical scribe, and Certified Medical Assistant (CMA). During the course of documenting the history, physical exam and medical decision making, I was functioning as a Stage manager. The creation of this record is the provider's dictation and/or activities during the visit.  Electronically signed by Burnard Haff, CMA 02/11/2024 1:02 PM     I agree that the scribed documentation is accurate and complete.

## 2024-08-03 NOTE — Progress Notes (Signed)
 SUBJECTIVE   Patient ID: Annette Morrison is a 68 y.o. (DOB June 04, 1956) female  Chief Complaint  Patient presents with  . 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  Patient is stable at this time -She does not take any preventative medications -She has not had a seizure in over 10 years  Chronic kidney disease Patient is stable at this time  Weight Loss -Patient has lost around 12lbs since last year -She reports eating enough food and has good diet   OBJECTIVE   Allergies[1]  Current Medications[2]  Problem List[3]    Surgical History[4]  Family History[5]  BP (!) 86/52 (BP Location: Left arm, Patient Position: Sitting)   Pulse 77   Temp 98.4 F (36.9 C) (Temporal)   Ht 1.62 m (5' 3.78)   Wt 35.4 kg (78 lb)   SpO2 96%   BMI 13.48 kg/m   Review of Systems  Constitutional: Negative.   HENT: Negative.    Eyes: Negative.   Respiratory: Negative.    Endocrine: Negative.   Genitourinary: Negative.   Musculoskeletal:  Positive for back pain and gait problem.  Skin:        Fingernails tingle at night  All other systems reviewed and are negative.   Physical Exam Constitutional:      Appearance: Normal appearance. She is normal weight.  HENT:     Head: Normocephalic.     Right Ear: Tympanic membrane, ear canal and external ear normal.     Left Ear: Tympanic membrane, ear canal and external ear normal.     Nose: Nose normal.     Mouth/Throat:     Mouth: Mucous membranes are moist.      Pharynx: Oropharynx is clear.  Eyes:     Extraocular Movements: Extraocular movements intact.     Conjunctiva/sclera: Conjunctivae normal.     Pupils: Pupils are equal, round, and reactive to light.  Cardiovascular:     Rate and Rhythm: Normal rate and regular rhythm.     Pulses: Normal pulses.     Heart sounds: Normal heart sounds.  Pulmonary:     Effort: Pulmonary effort is normal.     Breath sounds: Normal breath sounds.  Abdominal:     General: Abdomen is flat. Bowel sounds are normal.  Musculoskeletal:        General: Normal range of motion.     Cervical back: Normal range of motion.  Skin:    General: Skin is warm.  Neurological:     General: No focal deficit present.     Mental Status: She is alert and oriented to person, place, and time.  Psychiatric:        Mood and Affect: Mood normal.        Behavior: Behavior normal.        Thought Content: Thought content normal.        Judgment: Judgment normal.       ASSESSMENT/PLAN   1. Essential hypertension (Primary) Continue  Atenolol 50 mg every day, Spironolactone  25 mg every day, and Furosemide  20 mg every day  - Comprehensive Metabolic Panel; Future  2. Aortic atherosclerosis Continue Asprin 81 mg every day, Plavix  75 mg every day, and Imdur  30 mg every day  - CBC with Differential; Future  3. Pure hypercholesterolemia Continue Rosuvastatin  20 mg every day  - Lipid Panel; Future  4. Hypothyroidism (acquired) Continue Levothyroxine  75 mcg every day  - TSH; Future  5. Primary insomnia Continue Ambien 10 mg every day   6. Acute respiratory failure, unspecified whether with hypoxia or hypercapnia    (CMD) Patient is stable at this time, does not require oxygen  7. 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 Chronic kidney disease, stage 3a (CMD) Continue to monitor labs today   9. Jaundice - Bilirubin (Total, Direct, Indirect); Future  10. Abnormal weight loss -Patient counseled on  need for further testing for weight loss, patient declines to see treatment at this time -She declines treatment if she were to have cancer  Return in about 3 months (around 11/02/2024).   Risks, benefits, and alternatives of the medication(s) and treatment plan(s) were discussed, and she expressed understanding. Plan follow up as discussed or as needed. No barriers to treatment identified in this visit.      This document serves as a record of services personally performed by Elvie Shirk, MD.  It was created on their behalf by Burnard Haff, CMA, a trained medical scribe, and Certified Medical Assistant (CMA). During the course of documenting the history, physical exam and medical decision making, I was functioning as a Stage manager. The creation of this record is the provider's dictation and/or activities during the visit.  Electronically signed by Burnard Haff, CMA 08/03/2024 9:04 AM     I agree that the scribed documentation is accurate and complete.          [1] Allergies Allergen Reactions  . Hydrocodone  Hives and Rash  . Bee Sting Kit Hives  . Hydrocodone -Acetaminophen  Itching  . Paroxetine Mesylate Other (See Comments)    seizures  . Venom-Honey Bee Hives, Itching and Rash  [2] Current Outpatient Medications  Medication Sig Dispense Refill  . aspirin  (Bayer Low Dose Aspirin ) 81 mg EC tablet Take 81 mg by mouth once.    SABRA atenoloL (TENORMIN) 50 mg tablet TAKE 1 TABLET(50 MG) BY MOUTH DAILY 90 tablet 3  . clopidogreL  (PLAVIX ) 75 mg tablet TAKE 1 TABLET(75 MG) BY MOUTH DAILY 90 tablet 3  . famotidine  (PEPCID ) 20 mg tablet TAKE 1 TABLET(20 MG) BY MOUTH TWICE DAILY 180 tablet 3  . furosemide  (LASIX ) 20 mg tablet Take 1 tablet (20 mg total) by mouth daily. 90 tablet 1  . isosorbide  mononitrate (IMDUR ) 30 mg 24 hr tablet TAKE 1 TABLET(30 MG) BY MOUTH DAILY 90 tablet 3  . levothyroxine  (SYNTHROID ) 75 mcg tablet Take 1 tablet (75 mcg total) by mouth daily. 90 tablet 3   . naloxone (Narcan) 4 mg/actuation spry nasal spray Take by nasal route every 3 minutes until patient awakes or EMS arrives.    . Percocet 10-325 mg per tablet Take 1 tablet 3 times a day by oral route as needed for pain for 30 days.    . rosuvastatin  (CRESTOR ) 20 mg tablet TAKE 1 TABLET(20 MG) BY MOUTH DAILY 90 tablet 3  . spironolactone  (ALDACTONE ) 25 mg tablet TAKE 1 TABLET(25 MG) BY MOUTH DAILY 90 tablet 3  . zolpidem (AMBIEN)  10 mg tablet Take 1 tablet (10 mg total) by mouth nightly as needed for sleep. 90 tablet 3   No current facility-administered medications for this visit.  [3] Patient Active Problem List Diagnosis  . Pure hypercholesterolemia  . Chronic bilateral low back pain without sciatica  . Primary insomnia  . Hyperkeratosis of sole  . Seizures, transient (HCC)  . Hypothyroidism (acquired)  . DDD (degenerative disc disease), cervical  . 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 Post-menopause on HRT (hormone replacement therapy)  . Osteoporosis  . Nicotine  dependence  . History of colon polyps  . Family history of breast cancer  . Pain of left hip joint  . Essential hypertension  . Gastroesophageal reflux disease without esophagitis  . Acute respiratory failure, unspecified whether with hypoxia or hypercapnia    (CMD)  . Systolic congestive heart failure, unspecified HF chronicity    (CMD)  . Acute systolic CHF (congestive heart failure)    (CMD)  . Aortic atherosclerosis  [4] Past Surgical History: Procedure Laterality Date  . APPENDECTOMY     Procedure: APPENDECTOMY  . BLADDER SURGERY     Procedure: BLADDER SURGERY  . DENTAL SURGERY      Procedure: DENTAL SURGERY  . HEMORRHOID SURGERY     Procedure: HEMORRHOID SURGERY  . HYSTERECTOMY      Procedure: HYSTERECTOMY  . TUBAL LIGATION     Procedure: TUBAL LIGATION  [5] Family History Problem Relation Name Age of Onset  . Thyroid  cancer Mother    . Breast cancer Sister

## 2024-08-03 NOTE — Telephone Encounter (Signed)
 I received a call from the after-hours line regarding critical lab result for this patient.  Her hemoglobin today was 4.2.  She was seen in the office for a routine visit.  Her blood pressure was soft.  Unfortunately, I was unable to get a hold of the patient after calling her listed number multiple times.  The listed phone number for her emergency contact was nonfunctioning.  I placed a call to 911 and asked that a welfare check be completed.  I asked the operator to forward a message to the responding officer urging the patient to seek emergent care for evaluation of these abnormalities if she can be contacted.

## 2024-08-04 ENCOUNTER — Other Ambulatory Visit: Payer: Self-pay

## 2024-08-04 ENCOUNTER — Encounter (HOSPITAL_COMMUNITY): Payer: Self-pay

## 2024-08-04 ENCOUNTER — Observation Stay (HOSPITAL_COMMUNITY)

## 2024-08-04 ENCOUNTER — Inpatient Hospital Stay (HOSPITAL_COMMUNITY)
Admission: EM | Admit: 2024-08-04 | Discharge: 2024-08-07 | DRG: 812 | Disposition: A | Discharge status: Home / Self Care | Attending: Family Medicine | Admitting: Family Medicine

## 2024-08-04 DIAGNOSIS — D649 Anemia, unspecified: Secondary | ICD-10-CM | POA: Diagnosis present

## 2024-08-04 DIAGNOSIS — F1120 Opioid dependence, uncomplicated: Secondary | ICD-10-CM | POA: Diagnosis present

## 2024-08-04 DIAGNOSIS — Z90710 Acquired absence of both cervix and uterus: Secondary | ICD-10-CM

## 2024-08-04 DIAGNOSIS — R54 Age-related physical debility: Secondary | ICD-10-CM | POA: Diagnosis present

## 2024-08-04 DIAGNOSIS — K2970 Gastritis, unspecified, without bleeding: Secondary | ICD-10-CM | POA: Diagnosis present

## 2024-08-04 DIAGNOSIS — F17210 Nicotine dependence, cigarettes, uncomplicated: Secondary | ICD-10-CM | POA: Diagnosis present

## 2024-08-04 DIAGNOSIS — F17200 Nicotine dependence, unspecified, uncomplicated: Secondary | ICD-10-CM | POA: Diagnosis present

## 2024-08-04 DIAGNOSIS — E039 Hypothyroidism, unspecified: Secondary | ICD-10-CM | POA: Diagnosis present

## 2024-08-04 DIAGNOSIS — N1831 Chronic kidney disease, stage 3a: Secondary | ICD-10-CM | POA: Diagnosis present

## 2024-08-04 DIAGNOSIS — Z91030 Bee allergy status: Secondary | ICD-10-CM

## 2024-08-04 DIAGNOSIS — E876 Hypokalemia: Secondary | ICD-10-CM | POA: Diagnosis present

## 2024-08-04 DIAGNOSIS — Z79890 Hormone replacement therapy (postmenopausal): Secondary | ICD-10-CM

## 2024-08-04 DIAGNOSIS — I10 Essential (primary) hypertension: Secondary | ICD-10-CM | POA: Diagnosis not present

## 2024-08-04 DIAGNOSIS — Z888 Allergy status to other drugs, medicaments and biological substances status: Secondary | ICD-10-CM

## 2024-08-04 DIAGNOSIS — G40909 Epilepsy, unspecified, not intractable, without status epilepticus: Secondary | ICD-10-CM | POA: Diagnosis present

## 2024-08-04 DIAGNOSIS — I5022 Chronic systolic (congestive) heart failure: Secondary | ICD-10-CM | POA: Diagnosis not present

## 2024-08-04 DIAGNOSIS — D500 Iron deficiency anemia secondary to blood loss (chronic): Secondary | ICD-10-CM | POA: Diagnosis not present

## 2024-08-04 DIAGNOSIS — Z833 Family history of diabetes mellitus: Secondary | ICD-10-CM

## 2024-08-04 DIAGNOSIS — Z79899 Other long term (current) drug therapy: Secondary | ICD-10-CM

## 2024-08-04 DIAGNOSIS — N179 Acute kidney failure, unspecified: Secondary | ICD-10-CM | POA: Diagnosis present

## 2024-08-04 DIAGNOSIS — Q438 Other specified congenital malformations of intestine: Secondary | ICD-10-CM

## 2024-08-04 DIAGNOSIS — I130 Hypertensive heart and chronic kidney disease with heart failure and stage 1 through stage 4 chronic kidney disease, or unspecified chronic kidney disease: Secondary | ICD-10-CM | POA: Diagnosis present

## 2024-08-04 DIAGNOSIS — Z7902 Long term (current) use of antithrombotics/antiplatelets: Secondary | ICD-10-CM

## 2024-08-04 DIAGNOSIS — E782 Mixed hyperlipidemia: Secondary | ICD-10-CM | POA: Diagnosis present

## 2024-08-04 DIAGNOSIS — K2990 Gastroduodenitis, unspecified, without bleeding: Secondary | ICD-10-CM | POA: Diagnosis present

## 2024-08-04 DIAGNOSIS — Z7982 Long term (current) use of aspirin: Secondary | ICD-10-CM

## 2024-08-04 DIAGNOSIS — Z8249 Family history of ischemic heart disease and other diseases of the circulatory system: Secondary | ICD-10-CM

## 2024-08-04 DIAGNOSIS — I2510 Atherosclerotic heart disease of native coronary artery without angina pectoris: Secondary | ICD-10-CM | POA: Diagnosis present

## 2024-08-04 DIAGNOSIS — K449 Diaphragmatic hernia without obstruction or gangrene: Secondary | ICD-10-CM | POA: Diagnosis present

## 2024-08-04 DIAGNOSIS — Z885 Allergy status to narcotic agent status: Secondary | ICD-10-CM

## 2024-08-04 DIAGNOSIS — K3189 Other diseases of stomach and duodenum: Secondary | ICD-10-CM | POA: Diagnosis present

## 2024-08-04 DIAGNOSIS — K640 First degree hemorrhoids: Secondary | ICD-10-CM | POA: Diagnosis present

## 2024-08-04 LAB — COMPREHENSIVE METABOLIC PANEL WITH GFR
ALT: 13 U/L (ref 0–44)
AST: 25 U/L (ref 15–41)
Albumin: 4.1 g/dL (ref 3.5–5.0)
Alkaline Phosphatase: 77 U/L (ref 38–126)
Anion gap: 15 (ref 5–15)
BUN: 35 mg/dL — ABNORMAL HIGH (ref 8–23)
CO2: 16 mmol/L — ABNORMAL LOW (ref 22–32)
Calcium: 8.6 mg/dL — ABNORMAL LOW (ref 8.9–10.3)
Chloride: 106 mmol/L (ref 98–111)
Creatinine, Ser: 2.18 mg/dL — ABNORMAL HIGH (ref 0.44–1.00)
GFR, Estimated: 24 mL/min — ABNORMAL LOW (ref >60)
Glucose, Bld: 92 mg/dL (ref 70–99)
Potassium: 3.2 mmol/L — ABNORMAL LOW (ref 3.5–5.1)
Sodium: 137 mmol/L (ref 135–145)
Total Bilirubin: 0.3 mg/dL (ref 0.0–1.2)
Total Protein: 6.7 g/dL (ref 6.5–8.1)

## 2024-08-04 LAB — CBC WITH DIFFERENTIAL/PLATELET
Abs Immature Granulocytes: 0.08 K/uL — ABNORMAL HIGH (ref 0.00–0.07)
Basophils Absolute: 0 K/uL (ref 0.0–0.1)
Basophils Relative: 0 %
Eosinophils Absolute: 0.1 K/uL (ref 0.0–0.5)
Eosinophils Relative: 1 %
HCT: 14.3 % — ABNORMAL LOW (ref 36.0–46.0)
Hemoglobin: 3.6 g/dL — CL (ref 12.0–15.0)
Immature Granulocytes: 1 %
Lymphocytes Relative: 20 %
Lymphs Abs: 1.3 K/uL (ref 0.7–4.0)
MCH: 18.6 pg — ABNORMAL LOW (ref 26.0–34.0)
MCHC: 25.2 g/dL — ABNORMAL LOW (ref 30.0–36.0)
MCV: 73.7 fL — ABNORMAL LOW (ref 80.0–100.0)
Monocytes Absolute: 0.6 K/uL (ref 0.1–1.0)
Monocytes Relative: 9 %
Neutro Abs: 4.5 K/uL (ref 1.7–7.7)
Neutrophils Relative %: 69 %
Platelets: 203 K/uL (ref 150–400)
RBC: 1.94 MIL/uL — ABNORMAL LOW (ref 3.87–5.11)
RDW: 19.5 % — ABNORMAL HIGH (ref 11.5–15.5)
WBC: 6.5 K/uL (ref 4.0–10.5)
nRBC: 0.6 % — ABNORMAL HIGH (ref 0.0–0.2)

## 2024-08-04 LAB — IRON AND TIBC
Iron: 14 ug/dL — ABNORMAL LOW (ref 28–170)
Saturation Ratios: 3 % — ABNORMAL LOW (ref 10.4–31.8)
TIBC: 489 ug/dL — ABNORMAL HIGH (ref 250–450)
UIBC: 475 ug/dL

## 2024-08-04 LAB — MRSA NEXT GEN BY PCR, NASAL: MRSA by PCR Next Gen: NOT DETECTED

## 2024-08-04 LAB — TSH: TSH: 1.92 u[IU]/mL (ref 0.350–4.500)

## 2024-08-04 LAB — PREPARE RBC (CROSSMATCH)

## 2024-08-04 LAB — ABO/RH: ABO/RH(D): AB POS

## 2024-08-04 LAB — FERRITIN: Ferritin: 5 ng/mL — ABNORMAL LOW (ref 11–307)

## 2024-08-04 LAB — FOLATE: Folate: 6.7 ng/mL (ref >5.9)

## 2024-08-04 LAB — VITAMIN B12: Vitamin B-12: 375 pg/mL (ref 180–914)

## 2024-08-04 MED ORDER — CHLORHEXIDINE GLUCONATE CLOTH 2 % EX PADS
6.0000 | MEDICATED_PAD | Freq: Every day | CUTANEOUS | Status: DC
  Administered 2024-08-05 – 2024-08-06 (×2): 6 via TOPICAL

## 2024-08-04 MED ORDER — LEVOTHYROXINE SODIUM 125 MCG PO TABS
125.0000 ug | ORAL_TABLET | Freq: Every morning | ORAL | Status: DC
  Administered 2024-08-05 – 2024-08-06 (×2): 125 ug via ORAL
  Filled 2024-08-04 (×2): qty 1

## 2024-08-04 MED ORDER — ONDANSETRON HCL 4 MG PO TABS: 4.0000 mg | ORAL_TABLET | Freq: Four times a day (QID) | ORAL | Status: DC | PRN

## 2024-08-04 MED ORDER — ACETAMINOPHEN 325 MG PO TABS: 650.0000 mg | ORAL_TABLET | Freq: Four times a day (QID) | ORAL | Status: DC | PRN

## 2024-08-04 MED ORDER — ROSUVASTATIN CALCIUM 20 MG PO TABS
20.0000 mg | ORAL_TABLET | Freq: Every morning | ORAL | Status: DC
  Administered 2024-08-05 – 2024-08-06 (×2): 20 mg via ORAL
  Filled 2024-08-04 (×3): qty 1

## 2024-08-04 MED ORDER — OXYCODONE HCL 5 MG PO TABS: 5.0000 mg | ORAL_TABLET | Freq: Four times a day (QID) | ORAL | Status: DC | PRN

## 2024-08-04 MED ORDER — ESTROGENS CONJUGATED 0.625 MG PO TABS
0.6250 mg | ORAL_TABLET | Freq: Every morning | ORAL | Status: DC
  Administered 2024-08-05 – 2024-08-06 (×2): 0.625 mg via ORAL
  Filled 2024-08-04 (×4): qty 1

## 2024-08-04 MED ORDER — SODIUM CHLORIDE 0.9 % IV BOLUS
1000.0000 mL | Freq: Once | INTRAVENOUS | Status: AC
Start: 2024-08-04 — End: 2024-08-05
  Administered 2024-08-04: 1000 mL via INTRAVENOUS

## 2024-08-04 MED ORDER — SODIUM CHLORIDE 0.9% IV SOLUTION: Freq: Once | INTRAVENOUS | Status: DC

## 2024-08-04 MED ORDER — OXYCODONE-ACETAMINOPHEN 5-325 MG PO TABS: 1.0000 | ORAL_TABLET | Freq: Four times a day (QID) | ORAL | Status: DC | PRN

## 2024-08-04 MED ORDER — ACETAMINOPHEN 650 MG RE SUPP: 650.0000 mg | Freq: Four times a day (QID) | RECTAL | Status: DC | PRN

## 2024-08-04 MED ORDER — ZOLPIDEM TARTRATE 5 MG PO TABS: 5.0000 mg | ORAL_TABLET | Freq: Every evening | ORAL | Status: DC | PRN

## 2024-08-04 MED ORDER — SENNOSIDES-DOCUSATE SODIUM 8.6-50 MG PO TABS
2.0000 | ORAL_TABLET | Freq: Every day | ORAL | Status: DC
  Administered 2024-08-04: 2 via ORAL
  Filled 2024-08-04: qty 2

## 2024-08-04 MED ORDER — PANTOPRAZOLE SODIUM 40 MG IV SOLR
40.0000 mg | Freq: Once | INTRAVENOUS | Status: AC
  Administered 2024-08-04: 40 mg via INTRAVENOUS
  Filled 2024-08-04: qty 10

## 2024-08-04 MED ORDER — ONDANSETRON HCL 4 MG/2ML IJ SOLN: 4.0000 mg | Freq: Four times a day (QID) | INTRAMUSCULAR | Status: DC | PRN

## 2024-08-04 MED ORDER — PANTOPRAZOLE SODIUM 40 MG IV SOLR
40.0000 mg | Freq: Two times a day (BID) | INTRAVENOUS | Status: DC
  Administered 2024-08-04 – 2024-08-07 (×6): 40 mg via INTRAVENOUS
  Filled 2024-08-04 (×6): qty 10

## 2024-08-04 NOTE — ED Provider Notes (Signed)
 [place] EMERGENCY DEPARTMENT AT [facility] Provider Note   CSN: [id number] Arrival date & time: 08/04/24  0945     Patient presents with: Abnormal Lab   Tenea Sens is a 68 y.o. female.   Patient has a history of hypertension and coronary artery disease.  She is taking Plavix  and was seen by her doctor and found to have a low hemoglobin  The history is provided by the patient and medical records. No language interpreter was used.  Weakness Severity:  Moderate Onset quality:  Sudden Timing:  Constant Progression:  Worsening Chronicity:  New Context: not alcohol use   Relieved by:  Nothing Worsened by:  Nothing Ineffective treatments:  None tried Associated symptoms: no abdominal pain, no chest pain, no cough, no diarrhea, no frequency, no headaches and no seizures        Prior to Admission medications   Medication Sig Start Date End Date Taking? Authorizing Provider  aspirin  EC 81 MG EC tablet Take 1 tablet (81 mg total) by mouth daily. 06/30/19   Fairy Frames, MD  bisoprolol  (ZEBETA ) 5 MG tablet Take 0.5 tablets (2.5 mg total) by mouth daily. 06/30/19   Fairy Frames, MD  clopidogrel  (PLAVIX ) 75 MG tablet Take 1 tablet (75 mg total) by mouth daily with breakfast. 06/30/19   Fairy Frames, MD  estrogens, conjugated, (PREMARIN) 0.625 MG tablet Take 0.625 mg by mouth every morning. 03/25/19   [provider]  famotidine  (PEPCID ) 20 MG tablet Take 1 tablet (20 mg total) by mouth daily. Patient taking differently: Take 20 mg by mouth every morning.  03/11/19   Towana Ozell BROCKS, MD  furosemide  (LASIX ) 20 MG tablet Take 1 tablet (20 mg total) by mouth daily. 06/30/19   Fairy Frames, MD  isosorbide  mononitrate (IMDUR ) 30 MG 24 hr tablet Take 0.5 tablets (15 mg total) by mouth daily. 06/30/19   Fairy Frames, MD  levothyroxine  (SYNTHROID ) 125 MCG tablet Take 125 mcg by mouth every morning. 06/02/19   [provider]  lidocaine  4 % Place 1 patch  onto the skin daily. 07/15/22   Leath-Warren, Etta PARAS, NP  rosuvastatin  (CRESTOR ) 20 MG tablet Take 20 mg by mouth every morning. 04/22/19   [provider]  senna-docusate (SENOKOT-S) 8.6-50 MG tablet Take 2 tablets by mouth at bedtime. 07/15/22   Leath-Warren, Etta PARAS, NP  spironolactone  (ALDACTONE ) 25 MG tablet Take 0.5 tablets (12.5 mg total) by mouth daily. 06/30/19   Fairy Frames, MD  tiZANidine  (ZANAFLEX ) 4 MG tablet Take 1 tablet (4 mg total) by mouth every 6 (six) hours as needed for muscle spasms. 07/12/22   Jeannetta Channing CROME, NP  traMADol (ULTRAM) 50 MG tablet Take 50 mg by mouth daily as needed (pain).    [provider]  zolpidem (AMBIEN) 10 MG tablet Take 10 mg by mouth at bedtime as needed for sleep.  05/27/19   [provider]    Allergies: Brisdelle [paroxetine mesylate], Bee venom, and Hydrocodone     Review of Systems  Constitutional:  Negative for appetite change and fatigue.  HENT:  Negative for congestion, ear discharge and sinus pressure.   Eyes:  Negative for discharge.  Respiratory:  Negative for cough.   Cardiovascular:  Negative for chest pain.  Gastrointestinal:  Negative for abdominal pain and diarrhea.  Genitourinary:  Negative for frequency and hematuria.  Musculoskeletal:  Negative for back pain.  Skin:  Negative for rash.  Neurological:  Positive for weakness. Negative for seizures and headaches.  Psychiatric/Behavioral:  Negative for hallucinations.     Updated Vital Signs BP (!) 95/53   Pulse 67   Temp 97.6 F (36.4 C) (Oral)   Resp 18   Ht 5' 4 (1.626 m)   Wt 35.4 kg   SpO2 100%   BMI 13.39 kg/m   Physical Exam Vitals and nursing note reviewed.  Constitutional:      Appearance: She is well-developed.  HENT:     Head: Normocephalic.     Nose: Nose normal.  Eyes:     General: No scleral icterus.    Conjunctiva/sclera: Conjunctivae normal.  Neck:     Thyroid : No thyromegaly.  Cardiovascular:     Rate and  Rhythm: Normal rate and regular rhythm.     Heart sounds: No murmur heard.    No friction rub. No gallop.  Pulmonary:     Breath sounds: No stridor. No wheezing or rales.  Chest:     Chest wall: No tenderness.  Abdominal:     General: There is no distension.     Tenderness: There is no abdominal tenderness. There is no rebound.  Genitourinary:    Comments: Rectal exam heme-negative Musculoskeletal:        General: Normal range of motion.     Cervical back: Neck supple.  Lymphadenopathy:     Cervical: No cervical adenopathy.  Skin:    Findings: No erythema or rash.  Neurological:     Mental Status: She is alert and oriented to person, place, and time.     Motor: No abnormal muscle tone.     Coordination: Coordination normal.  Psychiatric:        Behavior: Behavior normal.     (all labs ordered are listed, but only abnormal results are displayed) Labs Reviewed  CBC WITH DIFFERENTIAL/PLATELET - Abnormal; Notable for the following components:      Result Value   RBC 1.94 (*)    Hemoglobin 3.6 (*)    HCT 14.3 (*)    MCV 73.7 (*)    MCH 18.6 (*)    MCHC 25.2 (*)    RDW 19.5 (*)    nRBC 0.6 (*)    Abs Immature Granulocytes 0.08 (*)    All other components within normal limits  COMPREHENSIVE METABOLIC PANEL WITH GFR - Abnormal; Notable for the following components:   Potassium 3.2 (*)    CO2 16 (*)    BUN 35 (*)    Creatinine, Ser 2.18 (*)    Calcium  8.6 (*)    GFR, Estimated 24 (*)    All other components within normal limits  TYPE AND SCREEN  ABO/RH  PREPARE RBC (CROSSMATCH)    EKG: None  Radiology: No results found.   Procedures   Medications Ordered in the ED  0.9 %  sodium chloride  infusion (Manually program via Guardrails IV Fluids) (has no administration in time range)  sodium chloride  0.9 % bolus 1,000 mL (has no administration in time range)  pantoprazole (PROTONIX) injection 40 mg (40 mg Intravenous Given 08/04/24 1037)  CRITICAL CARE Performed  by: Fairy Sermon Total critical care time: 45 minutes Critical care time was exclusive of separately billable procedures and treating other patients. Critical care was necessary to treat or prevent imminent or life-threatening deterioration. Critical care was time spent personally by me on the following activities: development of treatment plan with patient and/or surrogate as well as nursing, discussions with consultants, evaluation of patient's response to treatment, examination of patient, obtaining history from  patient or surrogate, ordering and performing treatments and interventions, ordering and review of laboratory studies, ordering and review of radiographic studies, pulse oximetry and re-evaluation of patient's condition.                                   Medical Decision Making Amount and/or Complexity of Data Reviewed Labs: ordered.  Risk Prescription drug management. Decision regarding hospitalization.   Patient with anemia secondary to Plavix  use.  Patient will be transfused and admitted to medicine     Final diagnoses:  Iron deficiency anemia due to chronic blood loss    ED Discharge Orders     None          Suzette Pac, MD 08/04/24 1637

## 2024-08-04 NOTE — Hospital Course (Signed)
 68 year old female with a history of hypertension, hyperlipidemia, hypothyroidism, seizure disorder, CKD stage III,  HFmrEF presenting with low Hgb on routine blood work from PCP.  The patient had her routine annual physical exam with her PCP on 08/03/2024.  Routine blood work was obtained and showed hemoglobin of 4.2 and serum creatinine of 2.12.  The patient was contacted to go to the emergency department for further evaluation and treatment. In speaking with the patient, the patient endorses fatigue and worsening dyspnea on exertion over the past 2 months.  She denies any chest pain, abdominal pain, nausea, vomiting, diarrhea.  She denies any hematochezia, melena, hematemesis, hematuria.  She denies any vaginal bleeding.  She has had some dizziness without any syncope.  She has had some generalized weakness.  She does not drink any alcohol or use any illicit drugs.  She denies any NSAID use.  She has not taken any new prescription or over-the-counter medications She continues to smoke 1 pack/day for the last 40 years.  She takes aspirin  and Plavix  on a daily basis. In the ED, the patient was afebrile hemodynamically stable with oxygen saturation 94% room air.  WBC 6.5, hemoglobin 3.6, platelet 203.  Sodium 137, potassium 3.2, bicarbonate 16, serum creatinine 2.18.  FOBT was negative.  There was no stool in the rectal vault.  3 units PRBC were ordered

## 2024-08-04 NOTE — H&P (Signed)
 History and Physical    Patient: Annette Morrison FMW:[id number] DOB: 1956/01/23 DOA: 08/04/2024 DOS: the patient was seen and examined on 08/04/2024 PCP: [name], MD  Patient coming from: Home  Chief Complaint:  Chief Complaint  Patient presents with   Abnormal Lab   HPI: Annette Morrison is a 68 year old female with a history of hypertension, hyperlipidemia, hypothyroidism, seizure disorder, CKD stage III,  HFmrEF presenting with low Hgb on routine blood work from PCP.  The patient had her routine annual physical exam with her PCP on 08/03/2024.  Routine blood work was obtained and showed hemoglobin of 4.2 and serum creatinine of 2.12.  The patient was contacted to go to the emergency department for further evaluation and treatment. In speaking with the patient, the patient endorses fatigue and worsening dyspnea on exertion over the past 2 months.  She denies any chest pain, abdominal pain, nausea, vomiting, diarrhea.  She denies any hematochezia, melena, hematemesis, hematuria.  She denies any vaginal bleeding.  She has had some dizziness without any syncope.  She has had some generalized weakness.  She does not drink any alcohol or use any illicit drugs.  She denies any NSAID use.  She has not taken any new prescription or over-the-counter medications She continues to smoke 1 pack/day for the last 40 years.  She takes aspirin  and Plavix  on a daily basis. In the ED, the patient was afebrile hemodynamically stable with oxygen saturation 94% room air.  WBC 6.5, hemoglobin 3.6, platelet 203.  Sodium 137, potassium 3.2, bicarbonate 16, serum creatinine 2.18.  FOBT was negative.  There was no stool in the rectal vault.  3 units PRBC were ordered  Review of Systems: As mentioned in the history of present illness. All other systems reviewed and are negative. Past Medical History:  Diagnosis Date   Hyperlipidemia    Hypertension    Thyroid  disease    Past Surgical History:  Procedure  Laterality Date   ABDOMINAL HYSTERECTOMY     LEFT HEART CATH AND CORONARY ANGIOGRAPHY N/A 06/28/2019   Procedure: LEFT HEART CATH AND CORONARY ANGIOGRAPHY;  Surgeon: Mady Bruckner, MD;  Location: [facility] INVASIVE [facility] LAB;  Service: Cardiovascular;  Laterality: N/A;   Social History:  reports that she has been smoking. She has a 48 pack-year smoking history. She has never used smokeless tobacco. She reports that she does not currently use alcohol. She reports that she does not use drugs.  Allergies  Allergen Reactions   Brisdelle [Paroxetine Mesylate] Other (See Comments)    seizure   Bee Venom Rash   Hydrocodone  Rash    Family History  Problem Relation Age of Onset   Diabetes Mother    Heart disease Father        Died of MI at age 62   CAD Sister     Prior to Admission medications   Medication Sig Start Date End Date Taking? Authorizing Provider  aspirin  EC 81 MG EC tablet Take 1 tablet (81 mg total) by mouth daily. 06/30/19   Fairy Frames, MD  bisoprolol  (ZEBETA ) 5 MG tablet Take 0.5 tablets (2.5 mg total) by mouth daily. 06/30/19   Fairy Frames, MD  clopidogrel  (PLAVIX ) 75 MG tablet Take 1 tablet (75 mg total) by mouth daily with breakfast. 06/30/19   Fairy Frames, MD  estrogens, conjugated, (PREMARIN) 0.625 MG tablet Take 0.625 mg by mouth every morning. 03/25/19   [provider]  famotidine  (PEPCID ) 20 MG tablet Take 1 tablet (20 mg total) by  mouth daily. Patient taking differently: Take 20 mg by mouth every morning.  03/11/19   Towana Ozell BROCKS, MD  furosemide  (LASIX ) 20 MG tablet Take 1 tablet (20 mg total) by mouth daily. 06/30/19   Fairy Frames, MD  isosorbide  mononitrate (IMDUR ) 30 MG 24 hr tablet Take 0.5 tablets (15 mg total) by mouth daily. 06/30/19   Fairy Frames, MD  levothyroxine  (SYNTHROID ) 125 MCG tablet Take 125 mcg by mouth every morning. 06/02/19   [provider]  lidocaine  4 % Place 1 patch onto the skin daily. 07/15/22   Leath-Warren,  Etta PARAS, NP  rosuvastatin  (CRESTOR ) 20 MG tablet Take 20 mg by mouth every morning. 04/22/19   [provider]  senna-docusate (SENOKOT-S) 8.6-50 MG tablet Take 2 tablets by mouth at bedtime. 07/15/22   Leath-Warren, Etta PARAS, NP  spironolactone  (ALDACTONE ) 25 MG tablet Take 0.5 tablets (12.5 mg total) by mouth daily. 06/30/19   Fairy Frames, MD  tiZANidine  (ZANAFLEX ) 4 MG tablet Take 1 tablet (4 mg total) by mouth every 6 (six) hours as needed for muscle spasms. 07/12/22   Jeannetta Channing CROME, NP  traMADol (ULTRAM) 50 MG tablet Take 50 mg by mouth daily as needed (pain).    [provider]  zolpidem (AMBIEN) 10 MG tablet Take 10 mg by mouth at bedtime as needed for sleep.  05/27/19   [provider]    Physical Exam: Vitals:   08/04/24 1100 08/04/24 1204 08/04/24 1207 08/04/24 1220  BP: (!) 101/59  (!) 100/55 (!) 95/53  Pulse: 70 70  67  Resp: 18 16  18   Temp:   (!) 97.4 F (36.3 C) 97.6 F (36.4 C)  TempSrc:  Oral  Oral  SpO2: (!) 70% 95%  100%  Weight:      Height:       GENERAL:  A&O x 3, NAD, well developed, cooperative, follows commands HEENT: [place]/AT, No thrush, No icterus, No oral ulcers Neck:  No neck mass, No meningismus, soft, supple CV: RRR, no S3, no S4, no rub, no JVD Lungs: Diminished breath sounds bilateral.  No wheezing.  Good air movement Abd: soft/NT +BS, nondistended Ext: No edema, no lymphangitis, no cyanosis, no rashes Neuro:  CN II-XII intact, strength 4/5 in RUE, RLE, strength 4/5 LUE, LLE; sensation intact bilateral; no dysmetria; babinski equivocal  Data Reviewed: Data reviewed above in history Assessment and Plan: Symptomatic Anemia -Hgb has drifted down from 13.4 on 11/10/2023 -GI consult -obtain anemia panel -clear liquid diet -temporarily holding ASA and plavix  -start pantoprazole  Acute on chronic renal failure--CKD 3A -baseline creatinine 1.1-1.4 -presented with serum creatinine 2.18 -renal US  -holding  spironolactone   Hypokalemia -replete -check mag  Tobacco abuse -cessation discussed  Essential Hypertension -holding bisoprolol , imdur , spironolactone   Mixed Hyperlipidemia -continue statin  Hypothyroidism -continue synthroid   Seizure disorder --She does not take any medications -She has not had a seizure in over 10 years  Opioid dependence - PDMP reviewed - percocet 10/325, #120, last refill 07/30/24  Chronic HFmrEF -compensated  -06/25/19 echo EF 40-45%, +WMA, normal RVF   Advance Care Planning: FULL  Consults: GI  Family Communication: significant other 10/1  Severity of Illness: The appropriate patient status for this patient is OBSERVATION. Observation status is judged to be reasonable and necessary in order to provide the required intensity of service to ensure the patient's safety. The patient's presenting symptoms, physical exam findings, and initial radiographic and laboratory data in the context of their medical condition is felt to  place them at decreased risk for further clinical deterioration. Furthermore, it is anticipated that the patient will be medically stable for discharge from the hospital within 2 midnights of admission.   Author: Alm Schneider, MD 08/04/2024 12:57 PM  For on call review [email or web address].

## 2024-08-04 NOTE — ED Triage Notes (Signed)
 Patient BIB [facility] from Home, Called out for complaint of low hemoglobin, had labs done yesterday at PCP, Hemoglobin 4.2. Decreased intake, Increased weakness, and getting winded with ambulation. EMS Vitals 11/70, 99% room air, CBG 120. 500ML  Normal saline started by EMS

## 2024-08-04 NOTE — Consult Note (Signed)
 Gastroenterology Consult   Referring Provider: No ref. provider found Primary Care Physician:  [name], MD Primary Gastroenterologist:  not established   Patient ID: Annette Morrison; [id number]; 14-Feb-1956   Admit date: 08/04/2024  LOS: 0 days   Date of Consultation: 08/04/2024  Reason for Consultation:  profound anemia   History of Present Illness   Stacie Templin is a 68 y.o. year old female with history of HTN, HLD, thyroid  disease, CHF, CKD 3, on chronic plavix  and ASA who presents with outpatient labs showing low hemoglobin, with c/o decrease PO intake, weakness and SOB with exertion. GI consulted for hemoglobin of 3.6.  ED course: Hgb 3.6, MCV 73.7 (hgb 4.2 yesterday, 10.6 five months ago) Potassium 3.2, BUN 35, creat 2.18, calcium  8.6 Afebrile, HR 70s, BP soft--range of 95/53-113/63  Consult: Reports she has been feeling fatigued for the past few months, low energy, requiring rest with any exertion. Some SOB as well. Denies any abdominal pain, rectal bleeding, melena, constipation or diarrhea. She is not taking NSAIDs other than daily 81mg  ASA. She is on plavix , last dose was last night. No nausea, vomiting, appetite changes, early satiety or weight loss. Denies any history of low blood counts in the past. States she has declined further colonoscopy in outpatient setting, despite recommendations by her PCP as she was concerned about something bad being found.   Last EGD: 2013 unsure of reasoning for evaluation or results Last Colonoscopy: 2013 normal per patient    Past Medical History:  Diagnosis Date   Hyperlipidemia    Hypertension    Thyroid  disease     Past Surgical History:  Procedure Laterality Date   ABDOMINAL HYSTERECTOMY     LEFT HEART CATH AND CORONARY ANGIOGRAPHY N/A 06/28/2019   Procedure: LEFT HEART CATH AND CORONARY ANGIOGRAPHY;  Surgeon: Mady Bruckner, MD;  Location: [facility];  Service: Cardiovascular;  Laterality: N/A;     Prior to Admission medications   Medication Sig Start Date End Date Taking? Authorizing Provider  aspirin  EC 81 MG EC tablet Take 1 tablet (81 mg total) by mouth daily. 06/30/19   Fairy Frames, MD  bisoprolol  (ZEBETA ) 5 MG tablet Take 0.5 tablets (2.5 mg total) by mouth daily. 06/30/19   Fairy Frames, MD  clopidogrel  (PLAVIX ) 75 MG tablet Take 1 tablet (75 mg total) by mouth daily with breakfast. 06/30/19   Fairy Frames, MD  estrogens, conjugated, (PREMARIN) 0.625 MG tablet Take 0.625 mg by mouth every morning. 03/25/19   [provider]  famotidine  (PEPCID ) 20 MG tablet Take 1 tablet (20 mg total) by mouth daily. Patient taking differently: Take 20 mg by mouth every morning.  03/11/19   Towana Ozell BROCKS, MD  furosemide  (LASIX ) 20 MG tablet Take 1 tablet (20 mg total) by mouth daily. 06/30/19   Fairy Frames, MD  isosorbide  mononitrate (IMDUR ) 30 MG 24 hr tablet Take 0.5 tablets (15 mg total) by mouth daily. 06/30/19   Fairy Frames, MD  levothyroxine  (SYNTHROID ) 125 MCG tablet Take 125 mcg by mouth every morning. 06/02/19   [provider]  lidocaine  4 % Place 1 patch onto the skin daily. 07/15/22   Leath-Warren, Etta PARAS, NP  rosuvastatin  (CRESTOR ) 20 MG tablet Take 20 mg by mouth every morning. 04/22/19   [provider]  senna-docusate (SENOKOT-S) 8.6-50 MG tablet Take 2 tablets by mouth at bedtime. 07/15/22   Leath-Warren, Etta PARAS, NP  spironolactone  (ALDACTONE ) 25 MG tablet Take 0.5 tablets (12.5 mg total) by mouth  daily. 06/30/19   Fairy Frames, MD  tiZANidine  (ZANAFLEX ) 4 MG tablet Take 1 tablet (4 mg total) by mouth every 6 (six) hours as needed for muscle spasms. 07/12/22   Jeannetta Channing CROME, NP  traMADol (ULTRAM) 50 MG tablet Take 50 mg by mouth daily as needed (pain).    [provider]  zolpidem (AMBIEN) 10 MG tablet Take 10 mg by mouth at bedtime as needed for sleep.  05/27/19   [provider]    Current Facility-Administered  Medications  Medication Dose Route Frequency Provider Last Rate Last Admin   0.9 %  sodium chloride  infusion (Manually program via Guardrails IV Fluids)   Intravenous Once Zammit, Joseph, MD       NOREEN ON 08/05/2024] Chlorhexidine Gluconate Cloth 2 % PADS 6 each  6 each Topical Q0600 Tat, David, MD       Current Outpatient Medications  Medication Sig Dispense Refill   aspirin  EC 81 MG EC tablet Take 1 tablet (81 mg total) by mouth daily. 30 tablet 0   bisoprolol  (ZEBETA ) 5 MG tablet Take 0.5 tablets (2.5 mg total) by mouth daily. 30 tablet 0   clopidogrel  (PLAVIX ) 75 MG tablet Take 1 tablet (75 mg total) by mouth daily with breakfast. 30 tablet 0   estrogens, conjugated, (PREMARIN) 0.625 MG tablet Take 0.625 mg by mouth every morning.     famotidine  (PEPCID ) 20 MG tablet Take 1 tablet (20 mg total) by mouth daily. (Patient taking differently: Take 20 mg by mouth every morning. ) 30 tablet 0   furosemide  (LASIX ) 20 MG tablet Take 1 tablet (20 mg total) by mouth daily. 30 tablet 0   isosorbide  mononitrate (IMDUR ) 30 MG 24 hr tablet Take 0.5 tablets (15 mg total) by mouth daily. 30 tablet 0   levothyroxine  (SYNTHROID ) 125 MCG tablet Take 125 mcg by mouth every morning.     lidocaine  4 % Place 1 patch onto the skin daily. 15 patch 0   rosuvastatin  (CRESTOR ) 20 MG tablet Take 20 mg by mouth every morning.     senna-docusate (SENOKOT-S) 8.6-50 MG tablet Take 2 tablets by mouth at bedtime. 30 tablet 0   spironolactone  (ALDACTONE ) 25 MG tablet Take 0.5 tablets (12.5 mg total) by mouth daily. 30 tablet 0   tiZANidine  (ZANAFLEX ) 4 MG tablet Take 1 tablet (4 mg total) by mouth every 6 (six) hours as needed for muscle spasms. 30 tablet 0   traMADol (ULTRAM) 50 MG tablet Take 50 mg by mouth daily as needed (pain).     zolpidem (AMBIEN) 10 MG tablet Take 10 mg by mouth at bedtime as needed for sleep.       Allergies as of 08/04/2024 - Review Complete 08/04/2024  Allergen Reaction Noted   Brisdelle  [paroxetine mesylate] Other (See Comments) 07/29/2018   Bee venom Rash 07/29/2018   Hydrocodone  Rash 07/29/2018    Family History  Problem Relation Age of Onset   Diabetes Mother    Heart disease Father        Died of MI at age 68   CAD Sister     Social History   Socioeconomic History   Marital status: Unknown    Spouse name: Not on file   Number of children: Not on file   Years of education: Not on file   Highest education level: Not on file  Occupational History   Not on file  Tobacco Use   Smoking status: Every Day    Current packs/day: 1.00  Average packs/day: 1 pack/day for 48.0 years (48.0 ttl pk-yrs)    Types: Cigarettes   Smokeless tobacco: Never  Vaping Use   Vaping status: Never Used  Substance and Sexual Activity   Alcohol use: Not Currently   Drug use: Never   Sexual activity: Not on file  Other Topics Concern   Not on file  Social History Narrative   Not on file   Social Drivers of Health   Financial Resource Strain: Not on file  Food Insecurity: Not on file  Transportation Needs: Not on file  Physical Activity: Not on file  Stress: Not on file  Social Connections: Unknown (03/16/2022)   Received from [facility]   Social Network    Social Network: Not on file  Intimate Partner Violence: Unknown (02/05/2022)   Received from [facility]   HITS    Physically Hurt: Not on file    Insult or Talk Down To: Not on file    Threaten Physical Harm: Not on file    Scream or Curse: Not on file     Review of Systems   Gen: Denies any fever, chills, loss of appetite, change in weight or weight loss +generalized weakness CV: Denies chest pain, heart palpitations, syncope, edema  Resp: Denies  cough, wheezing, coughing up blood, and pleurisy.  GI:  denies melena, hematochezia, nausea, vomiting, diarrhea, constipation, dysphagia, odyonophagia, early satiety or weight loss.  GU : Denies urinary burning, blood in urine, urinary frequency, and urinary  incontinence. MS: Denies joint pain, limitation of movement, swelling, cramps, and atrophy.  Derm: Denies rash, itching, dry skin, hives. Psych: Denies depression, anxiety, memory loss, hallucinations, and confusion. Heme: Denies bruising or bleeding Neuro:  Denies any headaches, dizziness, paresthesias, shaking  Physical Exam   Vital Signs in last 24 hours: Temp:  [97.4 F (36.3 C)-97.9 F (36.6 C)] 97.6 F (36.4 C) (10/01 1220) Pulse Rate:  [67-81] 67 (10/01 1220) Resp:  [15-21] 18 (10/01 1220) BP: (95-113)/(53-63) 95/53 (10/01 1220) SpO2:  [70 %-100 %] 100 % (10/01 1220) Weight:  [35.4 kg] 35.4 kg (10/01 0958)    General:   Alert,  Well-developed, well-nourished, pleasant and cooperative in NAD Head:  Normocephalic and atraumatic. Eyes:  Sclera clear, no icterus.   Conjunctiva pale Ears:  Normal auditory acuity. Mouth:  No deformity or lesions, dentition normal. Neck:  Supple; no masses Lungs:  Clear throughout to auscultation.   No wheezes, crackles, or rhonchi. No acute distress. Heart:  Regular rate and rhythm; no murmurs, clicks, rubs,  or gallops. Abdomen:  Soft, nontender and nondistended. No masses, hepatosplenomegaly or hernias noted. Normal bowel sounds, without guarding, and without rebound.    Msk:  Symmetrical without gross deformities. Normal posture. Extremities:  Without clubbing or edema. Neurologic:  Alert and  oriented x4. Skin:  Intact without significant lesions or rashes. Pale in color  Psych:  Alert and cooperative. Normal mood and affect.   Labs/Studies   Recent Labs Recent Labs    08/04/24 1010  WBC 6.5  HGB 3.6*  HCT 14.3*  PLT 203   BMET Recent Labs    08/04/24 1010  NA 137  K 3.2*  CL 106  CO2 16*  GLUCOSE 92  BUN 35*  CREATININE 2.18*  CALCIUM  8.6*   LFT Recent Labs    08/04/24 1010  PROT 6.7  ALBUMIN 4.1  AST 25  ALT 13  ALKPHOS 77  BILITOT 0.3     Assessment   Annette Morrison is  a 68 y.o. year old female with  history of HTN, HLD, thyroid  disease who presents to the ED with profound, symptomatic anemia on outpatient labs.  Profound anemia: -Hgb 4.2 yesterday, down to 3.6 on arrival -Last EGD/Colonoscopy in 2013 -denies rectal bleeding or melena, she has no GI symptoms -is on plavix /ASA chronically, last plavix  dose last night -denies other NSAID use  I discussed recommendations for bidirectional endoscopic evaluation for GI sources of blood loss given profound anemia, with the patient who initially stated she did not wish to undergo endoscopic evaluations though after further discussion regarding importance of evaluating her significant anemia as well as Indications, risks and benefits of procedure, Patient verbalized understanding and is in agreement to proceed with EGD and Colonoscopy.   Will re evaluate hemoglobin and overall hemodynamic stability tomorrow morning in hopes of prepping for colonoscopy tomororw and proceeding with EGD/Colonoscopy Friday as I do not suspect hemoglobin will be improved enough by morning to pursue procedures tomorrow.    Plan / Recommendations   -agree with transfusion of PRBCs x3 units -trend h&h -Monitor for overt GI bleeding -hold ASA/plavix   -clear liquids -EGD/colonoscopy, likely Friday due to profoundly low hemoglobin, will re evaluate in the AM -PPI BID    08/04/2024, 1:08 PM  Othman Masur L. Abdulmalik Darco, MSN, APRN, AGNP-C Adult-Gerontology Nurse Practitioner [facility]

## 2024-08-05 ENCOUNTER — Observation Stay (HOSPITAL_COMMUNITY)

## 2024-08-05 ENCOUNTER — Other Ambulatory Visit (HOSPITAL_COMMUNITY): Payer: Self-pay | Admitting: *Deleted

## 2024-08-05 DIAGNOSIS — N1831 Chronic kidney disease, stage 3a: Secondary | ICD-10-CM | POA: Diagnosis present

## 2024-08-05 DIAGNOSIS — F1120 Opioid dependence, uncomplicated: Secondary | ICD-10-CM | POA: Diagnosis present

## 2024-08-05 DIAGNOSIS — N179 Acute kidney failure, unspecified: Secondary | ICD-10-CM | POA: Diagnosis present

## 2024-08-05 DIAGNOSIS — K2990 Gastroduodenitis, unspecified, without bleeding: Secondary | ICD-10-CM | POA: Diagnosis present

## 2024-08-05 DIAGNOSIS — D649 Anemia, unspecified: Secondary | ICD-10-CM | POA: Diagnosis not present

## 2024-08-05 DIAGNOSIS — I2510 Atherosclerotic heart disease of native coronary artery without angina pectoris: Secondary | ICD-10-CM | POA: Diagnosis present

## 2024-08-05 DIAGNOSIS — K3189 Other diseases of stomach and duodenum: Secondary | ICD-10-CM | POA: Diagnosis present

## 2024-08-05 DIAGNOSIS — I5022 Chronic systolic (congestive) heart failure: Secondary | ICD-10-CM | POA: Diagnosis present

## 2024-08-05 DIAGNOSIS — Q438 Other specified congenital malformations of intestine: Secondary | ICD-10-CM | POA: Diagnosis not present

## 2024-08-05 DIAGNOSIS — K648 Other hemorrhoids: Secondary | ICD-10-CM | POA: Diagnosis not present

## 2024-08-05 DIAGNOSIS — E876 Hypokalemia: Secondary | ICD-10-CM | POA: Diagnosis present

## 2024-08-05 DIAGNOSIS — I130 Hypertensive heart and chronic kidney disease with heart failure and stage 1 through stage 4 chronic kidney disease, or unspecified chronic kidney disease: Secondary | ICD-10-CM | POA: Diagnosis present

## 2024-08-05 DIAGNOSIS — E039 Hypothyroidism, unspecified: Secondary | ICD-10-CM | POA: Diagnosis present

## 2024-08-05 DIAGNOSIS — F17210 Nicotine dependence, cigarettes, uncomplicated: Secondary | ICD-10-CM | POA: Diagnosis present

## 2024-08-05 DIAGNOSIS — D509 Iron deficiency anemia, unspecified: Secondary | ICD-10-CM

## 2024-08-05 DIAGNOSIS — Q439 Congenital malformation of intestine, unspecified: Secondary | ICD-10-CM | POA: Diagnosis not present

## 2024-08-05 DIAGNOSIS — Z79890 Hormone replacement therapy (postmenopausal): Secondary | ICD-10-CM | POA: Diagnosis not present

## 2024-08-05 DIAGNOSIS — K2970 Gastritis, unspecified, without bleeding: Secondary | ICD-10-CM | POA: Diagnosis present

## 2024-08-05 DIAGNOSIS — Z7982 Long term (current) use of aspirin: Secondary | ICD-10-CM | POA: Diagnosis not present

## 2024-08-05 DIAGNOSIS — R54 Age-related physical debility: Secondary | ICD-10-CM | POA: Diagnosis present

## 2024-08-05 DIAGNOSIS — Z7902 Long term (current) use of antithrombotics/antiplatelets: Secondary | ICD-10-CM | POA: Diagnosis not present

## 2024-08-05 DIAGNOSIS — K449 Diaphragmatic hernia without obstruction or gangrene: Secondary | ICD-10-CM | POA: Diagnosis present

## 2024-08-05 DIAGNOSIS — D500 Iron deficiency anemia secondary to blood loss (chronic): Principal | ICD-10-CM

## 2024-08-05 DIAGNOSIS — R9431 Abnormal electrocardiogram [ECG] [EKG]: Secondary | ICD-10-CM | POA: Diagnosis not present

## 2024-08-05 DIAGNOSIS — E782 Mixed hyperlipidemia: Secondary | ICD-10-CM | POA: Diagnosis present

## 2024-08-05 DIAGNOSIS — G40909 Epilepsy, unspecified, not intractable, without status epilepticus: Secondary | ICD-10-CM | POA: Diagnosis present

## 2024-08-05 DIAGNOSIS — Z833 Family history of diabetes mellitus: Secondary | ICD-10-CM | POA: Diagnosis not present

## 2024-08-05 DIAGNOSIS — Z8249 Family history of ischemic heart disease and other diseases of the circulatory system: Secondary | ICD-10-CM | POA: Diagnosis not present

## 2024-08-05 DIAGNOSIS — K640 First degree hemorrhoids: Secondary | ICD-10-CM | POA: Diagnosis present

## 2024-08-05 LAB — ECHOCARDIOGRAM COMPLETE
Area-P 1/2: 4.58 cm2
Height: 64 in
S' Lateral: 3.5 cm
Weight: 1257.6 [oz_av]

## 2024-08-05 LAB — CBC
HCT: 29.4 % — ABNORMAL LOW (ref 36.0–46.0)
Hemoglobin: 9 g/dL — ABNORMAL LOW (ref 12.0–15.0)
MCH: 24.7 pg — ABNORMAL LOW (ref 26.0–34.0)
MCHC: 30.6 g/dL (ref 30.0–36.0)
MCV: 80.8 fL (ref 80.0–100.0)
Platelets: 180 K/uL (ref 150–400)
RBC: 3.64 MIL/uL — ABNORMAL LOW (ref 3.87–5.11)
RDW: 19.7 % — ABNORMAL HIGH (ref 11.5–15.5)
WBC: 6 K/uL (ref 4.0–10.5)
nRBC: 0.5 % — ABNORMAL HIGH (ref 0.0–0.2)

## 2024-08-05 LAB — BASIC METABOLIC PANEL WITH GFR
Anion gap: 13 (ref 5–15)
BUN: 21 mg/dL (ref 8–23)
CO2: 17 mmol/L — ABNORMAL LOW (ref 22–32)
Calcium: 8.9 mg/dL (ref 8.9–10.3)
Chloride: 110 mmol/L (ref 98–111)
Creatinine, Ser: 1.48 mg/dL — ABNORMAL HIGH (ref 0.44–1.00)
GFR, Estimated: 39 mL/min — ABNORMAL LOW (ref >60)
Glucose, Bld: 87 mg/dL (ref 70–99)
Potassium: 3.1 mmol/L — ABNORMAL LOW (ref 3.5–5.1)
Sodium: 139 mmol/L (ref 135–145)

## 2024-08-05 LAB — MAGNESIUM: Magnesium: 2.2 mg/dL (ref 1.7–2.4)

## 2024-08-05 LAB — HIV ANTIBODY (ROUTINE TESTING W REFLEX): HIV Screen 4th Generation wRfx: NONREACTIVE

## 2024-08-05 MED ORDER — ORAL CARE MOUTH RINSE: 15.0000 mL | OROMUCOSAL | Status: DC | PRN

## 2024-08-05 MED ORDER — PEG 3350-KCL-NA BICARB-NACL 420 G PO SOLR
4000.0000 mL | Freq: Once | ORAL | Status: AC
  Administered 2024-08-05: 4000 mL via ORAL

## 2024-08-05 MED ORDER — BISACODYL 5 MG PO TBEC
10.0000 mg | DELAYED_RELEASE_TABLET | Freq: Once | ORAL | Status: AC
  Administered 2024-08-05: 10 mg via ORAL
  Filled 2024-08-05: qty 2

## 2024-08-05 MED ORDER — PERFLUTREN LIPID MICROSPHERE
1.0000 mL | INTRAVENOUS | Status: AC | PRN
  Administered 2024-08-05: 4 mL via INTRAVENOUS

## 2024-08-05 MED ORDER — SODIUM CHLORIDE 0.9 % IV SOLN: INTRAVENOUS | Status: AC

## 2024-08-05 MED ORDER — SODIUM CHLORIDE 0.9 % IV SOLN
200.0000 mg | Freq: Once | INTRAVENOUS | Status: AC
  Administered 2024-08-05: 200 mg via INTRAVENOUS
  Filled 2024-08-05: qty 10

## 2024-08-05 MED ORDER — POTASSIUM CHLORIDE CRYS ER 20 MEQ PO TBCR
40.0000 meq | EXTENDED_RELEASE_TABLET | ORAL | Status: AC
  Administered 2024-08-05 (×2): 40 meq via ORAL
  Filled 2024-08-05 (×2): qty 2

## 2024-08-05 NOTE — Plan of Care (Signed)
   Problem: Education: Goal: Knowledge of General Education information will improve Description: Including pain rating scale, medication(s)/side effects and non-pharmacologic comfort measures Outcome: Progressing   Problem: Elimination: Goal: Will not experience complications related to urinary retention Outcome: Progressing   Problem: Pain Managment: Goal: General experience of comfort will improve and/or be controlled Outcome: Progressing

## 2024-08-05 NOTE — Progress Notes (Signed)
 Transition of Care Department [facility]) has reviewed patient and no other TOC needs have been identified at this time. We will continue to monitor patient advancement through interdisciplinary progression rounds. If new patient transition needs arise, please place a TOC consult.   08/05/24 0813  TOC Brief Assessment  Insurance and Status Reviewed  Patient has primary care physician Yes  Home environment has been reviewed Lives with significant other.  Prior level of function: Independent.  Prior/Current Home Services No current home services  Social Drivers of Health Review SDOH reviewed no interventions necessary  Readmission risk has been reviewed Yes  Transition of care needs no transition of care needs at this time

## 2024-08-05 NOTE — Plan of Care (Signed)

## 2024-08-05 NOTE — Progress Notes (Signed)
*  PRELIMINARY RESULTS* Echocardiogram 2D Echocardiogram has been performed with Definity.  Annette Morrison 08/05/2024, 1:07 PM

## 2024-08-05 NOTE — H&P (View-Only) (Signed)
 Gastroenterology Progress Note   Referring Provider: No ref. provider found Primary Care Physician:  [name], MD Primary Gastroenterologist:  Ozell Hollingshead, MD Patient ID: Annette Morrison; [id number]; 07-Jan-1956   Subjective:    Feels a little better today. ECHO beginning. Did not want her clear liquid diet.   Objective:   Vital signs in last 24 hours: Temp:  [97.4 F (36.3 C)-99 F (37.2 C)] 97.7 F (36.5 C) (10/02 0743) Pulse Rate:  [64-89] 64 (10/02 1000) Resp:  [10-20] 15 (10/02 1000) BP: (90-129)/(49-72) 97/53 (10/02 1000) SpO2:  [70 %-100 %] 100 % (10/02 1000) Weight:  [35.8 kg] 35.8 kg (10/01 1345) Last BM Date : 08/03/24 General:   Alert, frail, thin, NAD Head:  Normocephalic and atraumatic. Eyes:  Sclera clear, no icterus. Conj pale. Chest: CTA bilaterally without rales, rhonchi, crackles.    Heart:  Regular rate and rhythm; no murmurs, clicks, rubs,  or gallops. Abdomen:  Soft, nontender and nondistended. Normal bowel sounds, without guarding, and without rebound.   Extremities:  Without clubbing, deformity or edema. Neurologic:  Alert and  oriented x4;  grossly normal neurologically. Skin:  Intact without significant lesions or rashes. Psych:  Alert and cooperative. Normal mood and affect.  Intake/Output from previous day: 10/01 0701 - 10/02 0700 In: 1720 [P.O.:240; Blood:980; IV Piggyback:500] Out: -  Intake/Output this shift: Total I/O In: 100 [P.O.:100] Out: -   Lab Results: CBC Recent Labs    08/04/24 1010 08/05/24 0430  WBC 6.5 6.0  HGB 3.6* 9.0*  HCT 14.3* 29.4*  MCV 73.7* 80.8  PLT 203 180   BMET Recent Labs    08/04/24 1010 08/05/24 0430  NA 137 139  K 3.2* 3.1*  CL 106 110  CO2 16* 17*  GLUCOSE 92 87  BUN 35* 21  CREATININE 2.18* 1.48*  CALCIUM  8.6* 8.9   LFTs Recent Labs    08/04/24 1010  BILITOT 0.3  ALKPHOS 77  AST 25  ALT 13  PROT 6.7  ALBUMIN 4.1   No results for input(s): LIPASE in the last 72  hours. PT/INR No results for input(s): LABPROT, INR in the last 72 hours.       Imaging Studies: US  RENAL Result Date: 08/04/2024 EXAM: US  Retroperitoneum Complete, Renal. CLINICAL HISTORY: Acute kidney injury (AKI). TECHNIQUE: Real-time ultrasound of the retroperitoneum (complete) with image documentation. COMPARISON: None provided. FINDINGS: RIGHT KIDNEY: The right kidney measures 11.4 x 4.8 x 4.6 cm (volume 137 cc). Shadowing right renal calculi up to 8 mm in diameter. 1.1 cm cyst of the right kidney upper pole with benign imaging characteristics. No hydronephrosis visualized. LEFT KIDNEY: Left kidney measures 11.0 x 4.3 x 5.0 cm (volume 124 cc). Multiple left renal cysts with benign imaging characteristics. No hydronephrosis, or renal stone visualized. BLADDER: Bladder volume 370 cc. Unremarkable as visualized. IMPRESSION: 1. Right nephrolithiasis with calculi up to 8 mm. 2. Multiple left renal cysts with benign imaging characteristics. Electronically signed by: Ryan Salvage MD 08/04/2024 06:24 PM EDT RP Workstation: [id number]  [7 weeks]  Assessment:   Annette Morrison is a 68 y.o. year old female with history of HTN, HLD, CKD, HFmrEF, thyroid  disease who presents to the ED with profound, symptomatic anemia on outpatient labs.   Profound anemia: -Hgb 4.2 yesterday, down to 3.6 on arrival -ferritin 5, iron 14, TIBC 489, iron sat 3, folic acid 6.7, B12 375 -Hgb 9 today, received 3 units prbcs -Last EGD/Colonoscopy in 2013 -denies rectal bleeding or melena, she has  no GI symptoms -is on plavix /ASA chronically, last plavix  dose 9/30 -denies other NSAID use -BPs remain soft, systolic ranging from 97-117 this morning. HR 64-89. Moved to telemetry floor.    Plan:   Plan for EGD/colonoscopy tomorrow.  I have discussed the risks, alternatives, benefits with regards to but not limited to the risk of reaction to medication, bleeding, infection, perforation and the patient is agreeable to  proceed. Written consent to be obtained. Follow up pending ECHO. Replete K, per attending. Clear liquids today. NPO after midnight.   LOS: 0 days   [name]. [name] [facility] [phone] 10/2/202510:49 AM

## 2024-08-05 NOTE — Progress Notes (Signed)
 Gastroenterology Progress Note   Referring Provider: No ref. provider found Primary Care Physician:  Annette Elvie BRAVO., MD Primary Gastroenterologist:  Annette Hollingshead, MD Patient ID: Annette Morrison; [id number]; 07-Jan-1956   Subjective:    Feels a little better today. ECHO beginning. Did not want her clear liquid diet.   Objective:   Vital signs in last 24 hours: Temp:  [97.4 F (36.3 C)-99 F (37.2 C)] 97.7 F (36.5 C) (10/02 0743) Pulse Rate:  [64-89] 64 (10/02 1000) Resp:  [10-20] 15 (10/02 1000) BP: (90-129)/(49-72) 97/53 (10/02 1000) SpO2:  [70 %-100 %] 100 % (10/02 1000) Weight:  [35.8 kg] 35.8 kg (10/01 1345) Last BM Date : 08/03/24 General:   Alert, frail, thin, NAD Head:  Normocephalic and atraumatic. Eyes:  Sclera clear, no icterus. Conj pale. Chest: CTA bilaterally without rales, rhonchi, crackles.    Heart:  Regular rate and rhythm; no murmurs, clicks, rubs,  or gallops. Abdomen:  Soft, nontender and nondistended. Normal bowel sounds, without guarding, and without rebound.   Extremities:  Without clubbing, deformity or edema. Neurologic:  Alert and  oriented x4;  grossly normal neurologically. Skin:  Intact without significant lesions or rashes. Psych:  Alert and cooperative. Normal mood and affect.  Intake/Output from previous day: 10/01 0701 - 10/02 0700 In: 1720 [P.O.:240; Blood:980; IV Piggyback:500] Out: -  Intake/Output this shift: Total I/O In: 100 [P.O.:100] Out: -   Lab Results: CBC Recent Labs    08/04/24 1010 08/05/24 0430  WBC 6.5 6.0  HGB 3.6* 9.0*  HCT 14.3* 29.4*  MCV 73.7* 80.8  PLT 203 180   BMET Recent Labs    08/04/24 1010 08/05/24 0430  NA 137 139  K 3.2* 3.1*  CL 106 110  CO2 16* 17*  GLUCOSE 92 87  BUN 35* 21  CREATININE 2.18* 1.48*  CALCIUM  8.6* 8.9   LFTs Recent Labs    08/04/24 1010  BILITOT 0.3  ALKPHOS 77  AST 25  ALT 13  PROT 6.7  ALBUMIN 4.1   No results for input(s): LIPASE in the last 72  hours. PT/INR No results for input(s): LABPROT, INR in the last 72 hours.       Imaging Studies: US  RENAL Result Date: 08/04/2024 EXAM: US  Retroperitoneum Complete, Renal. CLINICAL HISTORY: Acute kidney injury (AKI). TECHNIQUE: Real-time ultrasound of the retroperitoneum (complete) with image documentation. COMPARISON: None provided. FINDINGS: RIGHT KIDNEY: The right kidney measures 11.4 x 4.8 x 4.6 cm (volume 137 cc). Shadowing right renal calculi up to 8 mm in diameter. 1.1 cm cyst of the right kidney upper pole with benign imaging characteristics. No hydronephrosis visualized. LEFT KIDNEY: Left kidney measures 11.0 x 4.3 x 5.0 cm (volume 124 cc). Multiple left renal cysts with benign imaging characteristics. No hydronephrosis, or renal stone visualized. BLADDER: Bladder volume 370 cc. Unremarkable as visualized. IMPRESSION: 1. Right nephrolithiasis with calculi up to 8 mm. 2. Multiple left renal cysts with benign imaging characteristics. Electronically signed by: Ryan Salvage MD 08/04/2024 06:24 PM EDT RP Workstation: [id number]  [7 weeks]  Assessment:   Annette Morrison is a 68 y.o. year old female with history of HTN, HLD, CKD, HFmrEF, thyroid  disease who presents to the ED with profound, symptomatic anemia on outpatient labs.   Profound anemia: -Hgb 4.2 yesterday, down to 3.6 on arrival -ferritin 5, iron 14, TIBC 489, iron sat 3, folic acid 6.7, B12 375 -Hgb 9 today, received 3 units prbcs -Last EGD/Colonoscopy in 2013 -denies rectal bleeding or melena, she has  no GI symptoms -is on plavix /ASA chronically, last plavix  dose 9/30 -denies other NSAID use -BPs remain soft, systolic ranging from 97-117 this morning. HR 64-89. Moved to telemetry floor.    Plan:   Plan for EGD/colonoscopy tomorrow.  I have discussed the risks, alternatives, benefits with regards to but not limited to the risk of reaction to medication, bleeding, infection, perforation and the patient is agreeable to  proceed. Written consent to be obtained. Follow up pending ECHO. Replete K, per attending. Clear liquids today. NPO after midnight.   LOS: 0 days   Annette RAMAN. Annette Morrison [facility] [phone] 10/2/202510:49 AM

## 2024-08-05 NOTE — Progress Notes (Signed)
 [facility] PROGRESS NOTE   Annette Morrison FMW:[id number] DOB: 1956-02-19 DOA: 08/04/2024  PCP: [name], MD  Brief History: 68 year old female with a history of hypertension, hyperlipidemia, hypothyroidism, seizure disorder, CKD stage III,  HFmrEF presenting with low Hgb on routine blood work from PCP.  The patient had her routine annual physical exam with her PCP on 08/03/2024.  Routine blood work was obtained and showed hemoglobin of 4.2 and serum creatinine of 2.12.  The patient was contacted to go to the emergency department for further evaluation and treatment.  Patient was subsequently hospitalized for further management.  Consultants: Gastroenterology  Procedures: None yet    Subjective/Interval History: Patient denies any blood in the stool.  No black-colored stool.  No nausea vomiting.  Denies any weight loss.  Last colonoscopy was some more than 10 years ago but no concerning findings were noted at that time.    Assessment/Plan:  Microcytic anemia/symptomatic anemia Patient hemoglobin was noted to be 3.6 at admission.  Patient was transfused 3 units of PRBC with improvement in hemoglobin. Anemia panel shows ferritin of 5, iron of 14, TIBC 489, percent saturation 3.  Folic acid 6.7.  Vitamin B12 375.  Will order show will give her iron infusion. Gastroenterology has been consulted.  She will likely need endoscopic evaluation. Her aspirin  and Plavix  are on hold. Continue PPI  Acute kidney injury/chronic kidney disease stage IIIa Baseline creatinine is between 1.1-1.4.  Came in with creatinine of 2.18.  Improved to 1.48 this morning.  Hypokalemia Will supplement.  Check magnesium  level.  Chronic systolic CHF/coronary artery disease Last echo is from 2020 which showed EF of 40 to 45%.  Cardiac status is stable currently. Cardiac cath from 2020 showed multivessel CAD.  Medical management was recommended at that time.  She is on aspirin  and Plavix .  These  are currently on hold due to significant anemia.  It does not appear that she has followed up with cardiology in a few years.  Will benefit from referral to cardiology at discharge.  Could consider repeating echocardiogram to check her current cardiac function.    Essential hypertension Holding bisoprolol  Imdur  and spironolactone .  Blood pressure is borderline low.  Hypothyroidism Continue levothyroxine .  Hyperlipidemia Continue statin.  History of seizure disorder Has not had a seizure in over 10 years.  Not on any antiepileptics.  Opioid dependence According to prescriber database she is on Percocet 10 x 325.  120 tablets was prescribed in September.  DVT Prophylaxis: SCDs Code Status: Full code Family Communication: Discussed with patient Disposition Plan: Hopefully return home when workup is complete  Status is: Observation The patient will require care spanning > 2 midnights and should be moved to inpatient because: Severe anemia.  Possible GI procedures      Medications: Scheduled:  Chlorhexidine Gluconate Cloth  6 each Topical Q0600   estrogens (conjugated)  0.625 mg Oral q morning   levothyroxine   125 mcg Oral q morning   pantoprazole (PROTONIX) IV  40 mg Intravenous Q12H   rosuvastatin   20 mg Oral q morning   senna-docusate  2 tablet Oral QHS   Continuous: PRN:acetaminophen  **OR** acetaminophen , ondansetron  **OR** ondansetron  (ZOFRAN ) IV, mouth rinse, oxyCODONE -acetaminophen  **AND** oxyCODONE , zolpidem  Antibiotics: Anti-infectives (From admission, onward)    None       Objective:  Vital Signs  Vitals:   08/05/24 0500 08/05/24 0700 08/05/24 0743 08/05/24 0800  BP: 114/61 (!) 106/56  117/60  Pulse: 67 64  89  Resp: 14  15  20  Temp:   97.7 F (36.5 C)   TempSrc:   Oral   SpO2: 100% 99%  97%  Weight:      Height:        Intake/Output Summary (Last 24 hours) at 08/05/2024 0934 Last data filed at 08/04/2024 1947 Gross per 24 hour  Intake 1720 ml   Output --  Net 1720 ml   Filed Weights   08/04/24 0958 08/04/24 1345  Weight: 35.4 kg 35.8 kg    General appearance: Awake alert.  In no distress Resp: Clear to auscultation bilaterally.  Normal effort Cardio: S1-S2 is normal regular.  No S3-S4.  No rubs murmurs or bruit GI: Abdomen is soft.  Nontender nondistended.  Bowel sounds are present normal.  No masses organomegaly Extremities: No edema.  Full range of motion of lower extremities. Neurologic: Alert and oriented x3.  No focal neurological deficits.    Lab Results:  Data Reviewed: I have personally reviewed following labs and reports of the imaging studies  CBC: Recent Labs  Lab 08/04/24 1010 08/05/24 0430  WBC 6.5 6.0  NEUTROABS 4.5  --   HGB 3.6* 9.0*  HCT 14.3* 29.4*  MCV 73.7* 80.8  PLT 203 180    Basic Metabolic Panel: Recent Labs  Lab 08/04/24 1010 08/05/24 0430  NA 137 139  K 3.2* 3.1*  CL 106 110  CO2 16* 17*  GLUCOSE 92 87  BUN 35* 21  CREATININE 2.18* 1.48*  CALCIUM  8.6* 8.9    GFR: Estimated Creatinine Clearance: 21.1 mL/min (A) (by C-G formula based on SCr of 1.48 mg/dL (H)).  Liver Function Tests: Recent Labs  Lab 08/04/24 1010  AST 25  ALT 13  ALKPHOS 77  BILITOT 0.3  PROT 6.7  ALBUMIN 4.1    Thyroid  Function Tests: Recent Labs    08/04/24 1010  TSH 1.920    Anemia Panel: Recent Labs    08/04/24 1010 08/04/24 1048  VITAMINB12 375  --   FOLATE  --  6.7  FERRITIN 5*  --   TIBC 489*  --   IRON 14*  --     Recent Results (from the past 240 hours)  MRSA Next Gen by PCR, Nasal     Status: None   Collection Time: 08/04/24  8:15 AM   Specimen: Nasal Mucosa; Nasal Swab  Result Value Ref Range Status   MRSA by PCR Next Gen NOT DETECTED NOT DETECTED Final    Comment: (NOTE) The GeneXpert MRSA Assay (FDA approved for NASAL specimens only), is one component of a comprehensive MRSA colonization surveillance program. It is not intended to diagnose MRSA infection nor to  guide or monitor treatment for MRSA infections. Test performance is not FDA approved in patients less than 53 years old. Performed at [facility], [address], [place], [place] [address]       Radiology Studies: US  RENAL Result Date: 08/04/2024 EXAM: US  Retroperitoneum Complete, Renal. CLINICAL HISTORY: Acute kidney injury (AKI). TECHNIQUE: Real-time ultrasound of the retroperitoneum (complete) with image documentation. COMPARISON: None provided. FINDINGS: RIGHT KIDNEY: The right kidney measures 11.4 x 4.8 x 4.6 cm (volume 137 cc). Shadowing right renal calculi up to 8 mm in diameter. 1.1 cm cyst of the right kidney upper pole with benign imaging characteristics. No hydronephrosis visualized. LEFT KIDNEY: Left kidney measures 11.0 x 4.3 x 5.0 cm (volume 124 cc). Multiple left renal cysts with benign imaging characteristics. No hydronephrosis, or renal stone visualized. BLADDER: Bladder volume 370  cc. Unremarkable as visualized. IMPRESSION: 1. Right nephrolithiasis with calculi up to 8 mm. 2. Multiple left renal cysts with benign imaging characteristics. Electronically signed by: Ryan Salvage MD 08/04/2024 06:24 PM EDT RP Workstation: [id number]       LOS: 0 days   Annette Morrison  [facility] Pager on [email or web address]  08/05/2024, 9:34 AM

## 2024-08-06 DIAGNOSIS — D649 Anemia, unspecified: Secondary | ICD-10-CM | POA: Diagnosis not present

## 2024-08-06 LAB — MAGNESIUM: Magnesium: 2.1 mg/dL (ref 1.7–2.4)

## 2024-08-06 LAB — BASIC METABOLIC PANEL WITH GFR
Anion gap: 11 (ref 5–15)
BUN: 10 mg/dL (ref 8–23)
CO2: 17 mmol/L — ABNORMAL LOW (ref 22–32)
Calcium: 9.3 mg/dL (ref 8.9–10.3)
Chloride: 110 mmol/L (ref 98–111)
Creatinine, Ser: 1 mg/dL (ref 0.44–1.00)
GFR, Estimated: >60 mL/min (ref >60)
Glucose, Bld: 83 mg/dL (ref 70–99)
Potassium: 3.6 mmol/L (ref 3.5–5.1)
Sodium: 138 mmol/L (ref 135–145)

## 2024-08-06 LAB — CBC
HCT: 30.4 % — ABNORMAL LOW (ref 36.0–46.0)
Hemoglobin: 9.4 g/dL — ABNORMAL LOW (ref 12.0–15.0)
MCH: 25.4 pg — ABNORMAL LOW (ref 26.0–34.0)
MCHC: 30.9 g/dL (ref 30.0–36.0)
MCV: 82.2 fL (ref 80.0–100.0)
Platelets: 166 K/uL (ref 150–400)
RBC: 3.7 MIL/uL — ABNORMAL LOW (ref 3.87–5.11)
RDW: 21.1 % — ABNORMAL HIGH (ref 11.5–15.5)
WBC: 6.1 K/uL (ref 4.0–10.5)
nRBC: 0.3 % — ABNORMAL HIGH (ref 0.0–0.2)

## 2024-08-06 MED ORDER — LINACLOTIDE 145 MCG PO CAPS
290.0000 ug | ORAL_CAPSULE | Freq: Once | ORAL | Status: AC
  Administered 2024-08-06: 290 ug via ORAL
  Filled 2024-08-06: qty 2

## 2024-08-06 MED ORDER — BISACODYL 5 MG PO TBEC
10.0000 mg | DELAYED_RELEASE_TABLET | Freq: Once | ORAL | Status: AC
  Administered 2024-08-06: 10 mg via ORAL
  Filled 2024-08-06: qty 2

## 2024-08-06 MED ORDER — IPRATROPIUM-ALBUTEROL 0.5-2.5 (3) MG/3ML IN SOLN
3.0000 mL | RESPIRATORY_TRACT | Status: DC | PRN
  Administered 2024-08-06: 3 mL via RESPIRATORY_TRACT
  Filled 2024-08-06: qty 3

## 2024-08-06 MED ORDER — NICOTINE 14 MG/24HR TD PT24
14.0000 mg | MEDICATED_PATCH | Freq: Every day | TRANSDERMAL | Status: DC
  Administered 2024-08-06 – 2024-08-07 (×2): 14 mg via TRANSDERMAL
  Filled 2024-08-06 (×2): qty 1

## 2024-08-06 MED ORDER — PEG 3350-KCL-NA BICARB-NACL 420 G PO SOLR
2000.0000 mL | Freq: Once | ORAL | Status: AC
  Administered 2024-08-06: 2000 mL via ORAL

## 2024-08-06 NOTE — Progress Notes (Addendum)
 Briefly, this is a 68 year old female with history of HTN, HLD, CKD, CAD on aspirin  5X who presented with symptomatic anemia with hemoglobin of 3.6.  Her last dose of Plavix  was on 9/30.  Hemoglobin yesterday improved to 9, improved further to 9.4 this morning.  Her hemoglobin was 10.6 five months ago.  She presented with severe symptoms including fatigue and significant dyspnea on exertion.  No overt rectal bleeding, melena, abdominal pain, or changes in bowel movements noted.  Denies any further NSAIDs other than her aspirin  and Plavix .  This is a 68 year old female with hypertension hyperlipidemia, CKD, coronary artery disease on aspirin  and Plavix  presented with symptomatic anemia of hemoglobin 3.6.  GI is following the patient for severe symptomatic iron deficiency anemia.  She is on multiple conversations with her PCP regarding repeat colonoscopy for colon cancer screening.   Last had upper endoscopy and colonoscopy in 2013.  Reports prior colonoscopy was normal.   Wednesday she agreed to proceed with bidirectional endoscopy.  She underwent echocardiogram yesterday which revealed EF 50 to 55%.  Given all these findings, prep was initiated yesterday afternoon.  She unfortunately only completed about half of her prep yesterday and had about 5 bowel movements total.  This morning upon assessment of the patient she stated that she did not feel like she could drink all of it given it was an issue with taste.  Further conversation with her and family at the side, I reiterated to her that given the significance of her drop in hemoglobin that we needed to assess her upper and lower GI tract and that she needed to complete prep in order to have clear stools for adequate healing and to prevent potential complications.  She expressed her understanding and was agreeable to continue to drink prep this morning in effort to try to finish by noon and perform procedure late afternoon.  Nurse also has been at  bedside continuing to encourage intake of prep along with family however after repeat assessment at noon, she has only completed 1 cup of prep and has only had 1 additional bowel movement which was not clear.  Will perform both procedures tomorrow and continue prep today.  Linzess 290 mcg now Dulcolax 10 mg at 1700 Tap water enema for stimulation as well this afternoon and repeat enema tomorrow morning  Additional 2L prep ordered after she finishes what is left of current prep.    Charmaine Melia, MSN, APRN, FNP-BC, AGACNP-BC [facility]

## 2024-08-06 NOTE — Progress Notes (Signed)
 [facility] PROGRESS NOTE   [name] Morrison:[id number] DOB: Aug 07, 1956 DOA: 08/04/2024  PCP: [name], MD  Brief History: 68 year old female with a history of hypertension, hyperlipidemia, hypothyroidism, seizure disorder, CKD stage III,  HFmrEF presenting with low Hgb on routine blood work from PCP.  The patient had her routine annual physical exam with her PCP on 08/03/2024.  Routine blood work was obtained and showed hemoglobin of 4.2 and serum creatinine of 2.12.  The patient was contacted to go to the emergency department for further evaluation and treatment.  Patient was subsequently hospitalized for further management.  Consultants: Gastroenterology  Procedures: None yet    Subjective/Interval History: The patient was seen and examined this morning, stable no acute distress could not finish the oral contrast for colon prep this morning. Had 4 bowel movements. Otherwise hemodynamically stable, no acute distress   Assessment/Plan:  Microcytic anemia/symptomatic anemia POA hemoglobin 3.6-s/p 3U PRBC transfusion >>> 9, 9.4    Latest Ref Rng & Units 08/06/2024    4:32 AM 08/05/2024    4:30 AM 08/04/2024   10:10 AM  CBC  WBC 4.0 - 10.5 K/uL 6.1  6.0  6.5   Hemoglobin 12.0 - 15.0 g/dL 9.4  9.0  3.6   Hematocrit 36.0 - 46.0 % 30.4  29.4  14.3   Platelets 150 - 400 K/uL 166  180  203    Iron/TIBC/Ferritin/ %Sat    Component Value Date/Time   IRON 14 (L) 08/04/2024 1010   TIBC 489 (H) 08/04/2024 1010   FERRITIN 5 (L) 08/04/2024 1010   IRONPCTSAT 3 (L) 08/04/2024 1010    Vitamin B12 375.   - S/p iron infusion -GI following, planning for EGD/colonoscopy -Unfortunately patient was unable to complete the prep for colonoscopy may proceed with EGD continue with prep for possible colonoscopy in a.m.  Her aspirin  and Plavix  are on hold. Continue PPI  Acute kidney injury/chronic kidney disease stage IIIa -Improved creatinine Baseline creatinine is between  1.1-1.4.  Came in with creatinine of 2.18.  Improved to 1.48 this morning. Lab Results  Component Value Date   CREATININE 1.00 08/06/2024   CREATININE 1.48 (H) 08/05/2024   CREATININE 2.18 (H) 08/04/2024  Avoiding nephrotoxins  Hypokalemia Will supplement.  Check magnesium  level.  Chronic systolic CHF/coronary artery disease Last echo is from 2020 which showed EF of 40 to 45%.   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   -Medical management was recommended at that time.  -She is on aspirin  and Plavix  ... On hold for now due to anemia, GI bleed -She is to follow-up with her cardiologist postdischarge   Essential hypertension Blood pressure running soft, holding BP meds Holding bisoprolol  Imdur  and spironolactone .    Hypothyroidism Continue levothyroxine .  Hyperlipidemia Continue statin.  History of seizure disorder Has not had a seizure in over 10 years.  Not on any antiepileptics.  Opioid dependence According to prescriber database she is on Percocet 10 x 325.  120 tablets was prescribed in September.  DVT Prophylaxis: SCDs Code Status: Full code Family Communication: Discussed with patient Disposition Plan: Hopefully return home when workup is complete  Status is: Observation The patient will require care spanning > 2 midnights and should be moved to inpatient because: Severe anemia.  Possible GI procedures      Medications: Scheduled:  bisacodyl  10 mg Oral Once   Chlorhexidine Gluconate Cloth  6 each Topical Q0600   estrogens (conjugated)  0.625 mg  Oral q morning   levothyroxine   125 mcg Oral q morning   linaclotide  290 mcg Oral Once   nicotine   14 mg Transdermal Daily   pantoprazole (PROTONIX) IV  40 mg Intravenous Q12H   polyethylene glycol-electrolytes  2,000 mL Oral Once   rosuvastatin   20 mg Oral q morning   Continuous:  sodium chloride  10 mL/hr at 08/05/24 1336   PRN:acetaminophen  **OR** acetaminophen ,  ipratropium-albuterol , ondansetron  **OR** ondansetron  (ZOFRAN ) IV, mouth rinse, oxyCODONE -acetaminophen  **AND** oxyCODONE , zolpidem  Antibiotics: Anti-infectives (From admission, onward)    None       Objective:  Vital Signs  Vitals:   08/05/24 2000 08/06/24 0059 08/06/24 0452 08/06/24 0900  BP: 116/71 117/68 99/68 101/72  Pulse: 65  69 71  Resp: 20  16 15   Temp: 97.7 F (36.5 C) (!) 97.2 F (36.2 C) 98 F (36.7 C)   TempSrc: Oral Oral Oral   SpO2: 100% 98% 95% 100%  Weight:      Height:        Intake/Output Summary (Last 24 hours) at 08/06/2024 1255 Last data filed at 08/05/2024 1500 Gross per 24 hour  Intake 124.07 ml  Output --  Net 124.07 ml   Filed Weights   08/04/24 0958 08/04/24 1345 08/05/24 1119  Weight: 35.4 kg 35.8 kg 35.7 kg         General:  AAO x 3,  cooperative, no distress;   HEENT:  Normocephalic, PERRL, otherwise with in Normal limits   Neuro:  CNII-XII intact. , normal motor and sensation, reflexes intact   Lungs:   Clear to auscultation BL, Respirations unlabored,  No wheezes / crackles  Cardio:    S1/S2, RRR, No murmure, No Rubs or Gallops   Abdomen:  Soft, non-tender, bowel sounds active all four quadrants, no guarding or peritoneal signs.  Muscular  skeletal:  Limited exam -global generalized weaknesses - in bed, able to move all 4 extremities,   2+ pulses,  symmetric, No pitting edema  Skin:  Dry, warm to touch, negative for any Rashes,  Wounds: Please see nursing documentation         Lab Results:  Data Reviewed: I have personally reviewed following labs and reports of the imaging studies  CBC: Recent Labs  Lab 08/04/24 1010 08/05/24 0430 08/06/24 0432  WBC 6.5 6.0 6.1  NEUTROABS 4.5  --   --   HGB 3.6* 9.0* 9.4*  HCT 14.3* 29.4* 30.4*  MCV 73.7* 80.8 82.2  PLT 203 180 166    Basic Metabolic Panel: Recent Labs  Lab 08/04/24 1010 08/05/24 0430 08/05/24 0515 08/06/24 0432  NA 137 139  --  138  K 3.2* 3.1*   --  3.6  CL 106 110  --  110  CO2 16* 17*  --  17*  GLUCOSE 92 87  --  83  BUN 35* 21  --  10  CREATININE 2.18* 1.48*  --  1.00  CALCIUM  8.6* 8.9  --  9.3  MG  --   --  2.2 2.1    GFR: Estimated Creatinine Clearance: 31.2 mL/min (by C-G formula based on SCr of 1 mg/dL).  Liver Function Tests: Recent Labs  Lab 08/04/24 1010  AST 25  ALT 13  ALKPHOS 77  BILITOT 0.3  PROT 6.7  ALBUMIN 4.1    Thyroid  Function Tests: Recent Labs    08/04/24 1010  TSH 1.920    Anemia Panel: Recent Labs    08/04/24 1010 08/04/24 1048  VITAMINB12 375  --   FOLATE  --  6.7  FERRITIN 5*  --   TIBC 489*  --   IRON 14*  --     Recent Results (from the past 240 hours)  MRSA Next Gen by PCR, Nasal     Status: None   Collection Time: 08/04/24  8:15 AM   Specimen: Nasal Mucosa; Nasal Swab  Result Value Ref Range Status   MRSA by PCR Next Gen NOT DETECTED NOT DETECTED Final    Comment: (NOTE) The GeneXpert MRSA Assay (FDA approved for NASAL specimens only), is one component of a comprehensive MRSA colonization surveillance program. It is not intended to diagnose MRSA infection nor to guide or monitor treatment for MRSA infections. Test performance is not FDA approved in patients less than 21 years old. Performed at [facility], [address], [place], [place] [address]       Radiology Studies: ECHOCARDIOGRAM COMPLETE Result Date: 08/05/2024    ECHOCARDIOGRAM REPORT   Patient Name:   Annette Morrison Date of Exam: 08/05/2024 Medical Rec #:  [id number]      Height:       64.0 in Accession #:    [id number]     Weight:       78.9 lb Date of Birth:  07-15-1956      BSA:          1.318 m Patient Age:    68 years       BP:           97/53 mmHg Patient Gender: F              HR:           64 bpm. Exam Location:  [facility] Procedure: 2D Echo, Cardiac Doppler, Color Doppler and Intracardiac            Opacification Agent (Both Spectral and Color Flow Doppler were            utilized during  procedure). Indications:    Abnormal EKG  History:        Patient has prior history of Echocardiogram examinations, most                 recent 06/25/2019. CHF, CAD and Previous Myocardial Infarction;                 Risk Factors:Hypertension, Dyslipidemia and Current Smoker.  Sonographer:    Aida Pizza RCS Referring Phys: [phone] [facility]  Sonographer Comments: Technically difficult study due to poor echo windows. IMPRESSIONS  1. Left ventricular ejection fraction, by estimation, is 50 to 55%. The left ventricle has low normal function. The left ventricle demonstrates regional wall motion abnormalities, mid-inferolateral wall is akinetic (SAX view). Left ventricular diastolic  parameters are indeterminate.  2. Right ventricular systolic function is low normal. The right ventricular size is moderately enlarged.  3. Left atrial size was mildly dilated.  4. The mitral valve is normal in structure. Mild mitral valve regurgitation. No evidence of mitral stenosis.  5. The tricuspid valve is degenerative.  6. The aortic valve is normal in structure. Aortic valve regurgitation is not visualized. No aortic stenosis is present.  7. The inferior vena cava is normal in size with greater than 50% respiratory variability, suggesting right atrial pressure of 3 mmHg. FINDINGS  Left Ventricle: Left ventricular ejection fraction, by estimation, is 50 to 55%. The left ventricle has low normal function. The left ventricle demonstrates regional wall motion abnormalities.  Definity contrast agent was given IV to delineate the left ventricular endocardial borders. Strain was performed and the global longitudinal strain is indeterminate. The left ventricular internal cavity size was normal in size. There is no left ventricular hypertrophy. Left ventricular diastolic parameters are indeterminate. Right Ventricle: The right ventricular size is moderately enlarged. No increase in right ventricular wall thickness. Right ventricular  systolic function is low normal. Left Atrium: Left atrial size was mildly dilated. Right Atrium: Right atrial size was normal in size. Pericardium: There is no evidence of pericardial effusion. Mitral Valve: The mitral valve is normal in structure. Mild mitral valve regurgitation. No evidence of mitral valve stenosis. Tricuspid Valve: The tricuspid valve is degenerative in appearance. Tricuspid valve regurgitation is mild . No evidence of tricuspid stenosis. Aortic Valve: The aortic valve is normal in structure. Aortic valve regurgitation is not visualized. No aortic stenosis is present. Pulmonic Valve: The pulmonic valve was normal in structure. Pulmonic valve regurgitation is not visualized. No evidence of pulmonic stenosis. Aorta: The aortic root is normal in size and structure. Venous: The inferior vena cava is normal in size with greater than 50% respiratory variability, suggesting right atrial pressure of 3 mmHg. IAS/Shunts: No atrial level shunt detected by color flow Doppler. Additional Comments: 3D was performed not requiring image post processing on an independent workstation and was indeterminate.  LEFT VENTRICLE PLAX 2D LVIDd:         4.60 cm   Diastology LVIDs:         3.50 cm   LV e' medial:    8.70 cm/s LV PW:         0.70 cm   LV E/e' medial:  10.8 LV IVS:        0.80 cm   LV e' lateral:   8.95 cm/s LVOT diam:     1.70 cm   LV E/e' lateral: 10.5 LV SV:         54 LV SV Index:   41 LVOT Area:     2.27 cm  RIGHT VENTRICLE RV S prime:     12.70 cm/s TAPSE (M-mode): 2.0 cm LEFT ATRIUM           Index        RIGHT ATRIUM           Index LA diam:      3.10 cm 2.35 cm/m   RA Area:     10.00 cm LA Vol (A2C): 44.6 ml 33.85 ml/m  RA Volume:   23.20 ml  17.61 ml/m LA Vol (A4C): 22.9 ml 17.38 ml/m  AORTIC VALVE LVOT Vmax:   113.00 cm/s LVOT Vmean:  62.600 cm/s LVOT VTI:    0.239 m  AORTA Ao Root diam: 2.90 cm MITRAL VALVE               TRICUSPID VALVE MV Area (PHT): 4.58 cm    TR Peak grad:   25.8 mmHg MV  Decel Time: 166 msec    TR Vmax:        254.00 cm/s MV E velocity: 93.90 cm/s MV A velocity: 81.30 cm/s  SHUNTS MV E/A ratio:  1.15        Systemic VTI:  0.24 m                            Systemic Diam: 1.70 cm Vishnu Priya Mallipeddi Electronically signed by Diannah Late Mallipeddi Signature Date/Time: 08/05/2024/4:35:20 PM  Final    US  RENAL Result Date: 08/04/2024 EXAM: US  Retroperitoneum Complete, Renal. CLINICAL HISTORY: Acute kidney injury (AKI). TECHNIQUE: Real-time ultrasound of the retroperitoneum (complete) with image documentation. COMPARISON: None provided. FINDINGS: RIGHT KIDNEY: The right kidney measures 11.4 x 4.8 x 4.6 cm (volume 137 cc). Shadowing right renal calculi up to 8 mm in diameter. 1.1 cm cyst of the right kidney upper pole with benign imaging characteristics. No hydronephrosis visualized. LEFT KIDNEY: Left kidney measures 11.0 x 4.3 x 5.0 cm (volume 124 cc). Multiple left renal cysts with benign imaging characteristics. No hydronephrosis, or renal stone visualized. BLADDER: Bladder volume 370 cc. Unremarkable as visualized. IMPRESSION: 1. Right nephrolithiasis with calculi up to 8 mm. 2. Multiple left renal cysts with benign imaging characteristics. Electronically signed by: Ryan Salvage MD 08/04/2024 06:24 PM EDT RP Workstation: [id number]       LOS: 1 day     Time spent 50 minutes in seeing evaluating the patient  Chronic plan of care, discussion with gastroenterologist   SIGNED: Adriana DELENA Grams, MD, FHM. FAAFP [facility],  Pager (please use [email or web address] to page/text)  Please use Epic Secure Chat for non-urgent communication (7AM-7PM) If 7PM-7AM, please contact night-coverage [email or web address],  08/06/2024, 12:55 PM   08/06/2024, 12:55 PM

## 2024-08-06 NOTE — Progress Notes (Signed)
 Patient refused to complete bowel prep, stating she was unable to tolerate further intake.  She consumed approximately half of the prescribed solution and had 5 bowel movements.  Patient verbalized understanding after education provided regarding the importance of completing the prep to ensure adequate visualization for today's scheduled EGD and colonoscopy.

## 2024-08-06 NOTE — Plan of Care (Signed)
   Problem: Education: Goal: Knowledge of General Education information will improve Description Including pain rating scale, medication(s)/side effects and non-pharmacologic comfort measures Outcome: Progressing   Problem: Health Behavior/Discharge Planning: Goal: Ability to manage health-related needs will improve Outcome: Progressing

## 2024-08-06 NOTE — Anesthesia Preprocedure Evaluation (Addendum)
 Anesthesia Evaluation    Airway Mallampati: II  TM Distance: >3 FB Neck ROM: Full    Dental no notable dental hx. (+) Teeth Intact, Dental Advisory Given   Pulmonary COPD, Current Smoker   Pulmonary exam normal breath sounds clear to auscultation       Cardiovascular hypertension, + Past MI and +CHF  Normal cardiovascular exam Rhythm:Regular Rate:Normal  EF 50%. Diastolic indeterminate.  RV fxn decreased   Neuro/Psych CVA    GI/Hepatic   Endo/Other  Hypothyroidism    Renal/GU CRFRenal diseaseStage 3a CKD     Musculoskeletal   Abdominal   Peds  Hematology  (+) Blood dyscrasia, anemia   Anesthesia Other Findings   Reproductive/Obstetrics                              Anesthesia Physical Anesthesia Plan  ASA: 3  Anesthesia Plan: General   Post-op Pain Management: Minimal or no pain anticipated   Induction: Intravenous  PONV Risk Score and Plan: Propofol infusion  Airway Management Planned: Natural Airway and Nasal Cannula  Additional Equipment: None  Intra-op Plan:   Post-operative Plan:   Informed Consent: I have reviewed the patients History and Physical, chart, labs and discussed the procedure including the risks, benefits and alternatives for the proposed anesthesia with the patient or authorized representative who has indicated his/her understanding and acceptance.     Dental advisory given  Plan Discussed with: CRNA  Anesthesia Plan Comments:          Anesthesia Quick Evaluation

## 2024-08-07 ENCOUNTER — Inpatient Hospital Stay (HOSPITAL_COMMUNITY): Admitting: Anesthesiology

## 2024-08-07 ENCOUNTER — Encounter (HOSPITAL_COMMUNITY)
Admission: EM | Disposition: A | Discharge status: Home / Self Care | Payer: Self-pay | Source: Home / Self Care | Attending: Family Medicine

## 2024-08-07 DIAGNOSIS — K640 First degree hemorrhoids: Secondary | ICD-10-CM

## 2024-08-07 DIAGNOSIS — K3189 Other diseases of stomach and duodenum: Secondary | ICD-10-CM

## 2024-08-07 DIAGNOSIS — K648 Other hemorrhoids: Secondary | ICD-10-CM

## 2024-08-07 DIAGNOSIS — D649 Anemia, unspecified: Secondary | ICD-10-CM | POA: Diagnosis not present

## 2024-08-07 DIAGNOSIS — Q439 Congenital malformation of intestine, unspecified: Secondary | ICD-10-CM

## 2024-08-07 DIAGNOSIS — K449 Diaphragmatic hernia without obstruction or gangrene: Secondary | ICD-10-CM

## 2024-08-07 DIAGNOSIS — K2970 Gastritis, unspecified, without bleeding: Secondary | ICD-10-CM

## 2024-08-07 HISTORY — PX: ESOPHAGOGASTRODUODENOSCOPY: SHX5428

## 2024-08-07 HISTORY — PX: COLONOSCOPY: SHX5424

## 2024-08-07 SURGERY — COLONOSCOPY
Anesthesia: General

## 2024-08-07 MED ORDER — LACTATED RINGERS IV SOLN: INTRAVENOUS | Status: DC

## 2024-08-07 MED ORDER — MIDAZOLAM HCL 2 MG/2ML IJ SOLN
INTRAMUSCULAR | Status: AC
  Filled 2024-08-07: qty 2

## 2024-08-07 MED ORDER — LIDOCAINE HCL (CARDIAC) PF 100 MG/5ML IV SOSY
PREFILLED_SYRINGE | INTRAVENOUS | Status: DC | PRN
Start: 2024-08-07 — End: 2024-08-07
  Administered 2024-08-07: 60 mg via INTRAVENOUS

## 2024-08-07 MED ORDER — EPHEDRINE SULFATE (PRESSORS) 50 MG/ML IJ SOLN
INTRAMUSCULAR | Status: DC | PRN
Start: 2024-08-07 — End: 2024-08-07
  Administered 2024-08-07: 10 mg via INTRAVENOUS
  Administered 2024-08-07 (×2): 5 mg via INTRAVENOUS

## 2024-08-07 MED ORDER — PROPOFOL 500 MG/50ML IV EMUL
INTRAVENOUS | Status: AC
  Filled 2024-08-07: qty 50

## 2024-08-07 MED ORDER — PANTOPRAZOLE SODIUM 40 MG PO TBEC: 40.0000 mg | DELAYED_RELEASE_TABLET | Freq: Two times a day (BID) | ORAL | 1 refills | Status: AC

## 2024-08-07 MED ORDER — PROPOFOL 10 MG/ML IV BOLUS
INTRAVENOUS | Status: AC
  Filled 2024-08-07: qty 20

## 2024-08-07 MED ORDER — MIDAZOLAM HCL 5 MG/5ML IJ SOLN
INTRAMUSCULAR | Status: DC | PRN
  Administered 2024-08-07: 2 mg via INTRAVENOUS

## 2024-08-07 MED ORDER — EPHEDRINE 5 MG/ML INJ
INTRAVENOUS | Status: AC
  Filled 2024-08-07: qty 5

## 2024-08-07 MED ORDER — LEVOTHYROXINE SODIUM 125 MCG PO TABS: 125.0000 ug | ORAL_TABLET | Freq: Every morning | ORAL | 0 refills | Status: AC

## 2024-08-07 MED ORDER — FERROUS SULFATE 325 (65 FE) MG PO TBEC: 325.0000 mg | DELAYED_RELEASE_TABLET | Freq: Two times a day (BID) | ORAL | 3 refills | Status: AC

## 2024-08-07 MED ORDER — LACTATED RINGERS IV SOLN: INTRAVENOUS | Status: DC | PRN

## 2024-08-07 MED ORDER — DEXMEDETOMIDINE HCL IN NACL 80 MCG/20ML IV SOLN
INTRAVENOUS | Status: DC | PRN
  Administered 2024-08-07 (×2): 8 ug via INTRAVENOUS

## 2024-08-07 MED ORDER — LIDOCAINE 2% (20 MG/ML) 5 ML SYRINGE
INTRAMUSCULAR | Status: AC
  Filled 2024-08-07: qty 5

## 2024-08-07 MED ORDER — PROPOFOL 10 MG/ML IV BOLUS
INTRAVENOUS | Status: DC | PRN
  Administered 2024-08-07 (×13): 50 mg via INTRAVENOUS

## 2024-08-07 MED ORDER — NICOTINE 14 MG/24HR TD PT24: 14.0000 mg | MEDICATED_PATCH | Freq: Every day | TRANSDERMAL | 0 refills | Status: AC

## 2024-08-07 NOTE — Brief Op Note (Signed)
 Patient underwent EGD and colonoscopy  under propofol sedation.  Tolerated the procedure adequately.   FINDINGS:  EGD  - Normal esophagus. - Erythematous mucosa in the antrum. Biopsied. - Normal duodenal bulb and second portion of the duodenum. Biopsied. - Gastroesophageal flap valve classified as Hill Grade IV ( no fold, wide open lumen, hiatal hernia present) . - 3 cm hiatal hernia. - No evidence of bood or active bleeding   Colonoscopy  - Tortuous colon. - The examined portion of the ileum was normal. - Non- bleeding internal hemorrhoids. - No specimens collected.  RECOMMENDATIONS  - Upper endoscopy and colonoscopy does not explain cause of severe iron deficiency anemia without overt bleeding , will schedule patient for capsule endoscopy as outpatient   -Follow up path  - No absolute GI contraindication to restarting clinically indicated anticoagulation/ antiplatelet drugs while patientis closely monitored   - IV iron loading while patient is admitted and follow up as outpatient with hematology for severe IDA   - Resume previous diet.   - Continue present medications.   - Repeat colonoscopy in 10 years for screening purposes.   Please recall GI with any further questions.  Quill Grinder Faizan Shakeya Kerkman, MD Gastroenterology and Hepatology [facility]astro[facility]

## 2024-08-07 NOTE — Plan of Care (Signed)
  Problem: Education: Goal: Knowledge of General Education information will improve Description: Including pain rating scale, medication(s)/side effects and non-pharmacologic comfort measures Outcome: Progressing   Problem: Clinical Measurements: Goal: Respiratory complications will improve Outcome: Progressing   Problem: Coping: Goal: Level of anxiety will decrease Outcome: Progressing   Problem: Elimination: Goal: Will not experience complications related to bowel motility Outcome: Progressing   Problem: Pain Managment: Goal: General experience of comfort will improve and/or be controlled Outcome: Progressing   Problem: Safety: Goal: Ability to remain free from injury will improve Outcome: Progressing   Problem: Skin Integrity: Goal: Risk for impaired skin integrity will decrease Outcome: Progressing

## 2024-08-07 NOTE — Plan of Care (Signed)

## 2024-08-07 NOTE — Op Note (Signed)
 [facility] Patient Name: Annette Morrison Procedure Date: 08/07/2024 10:12 AM MRN: [id number] Date of Birth: August 30, 1956 Attending MD: Deatrice Dine , MD, [id number] CSN: [id number] Age: 68 Admit Type: Inpatient Procedure:                Upper GI endoscopy Indications:              Unexplained iron deficiency anemia Providers:                Deatrice Dine, MD, Harlene Lips, Bascom Blush Referring MD:              Medicines:                Monitored Anesthesia Care Complications:            No immediate complications. Estimated Blood Loss:     Estimated blood loss was minimal. Procedure:                Pre-Anesthesia Assessment:                           - Prior to the procedure, a History and Physical                            was performed, and patient medications and                            allergies were reviewed. The patient's tolerance of                            previous anesthesia was also reviewed. The risks                            and benefits of the procedure and the sedation                            options and risks were discussed with the patient.                            All questions were answered, and informed consent                            was obtained. Prior Anticoagulants: The patient has                            taken Plavix  (clopidogrel ), last dose was 5 days                            prior to procedure. ASA Grade Assessment: III - A                            patient with severe systemic disease. After                            reviewing the risks and benefits, the patient was  deemed in satisfactory condition to undergo the                            procedure.                           After obtaining informed consent, the endoscope was                            passed under direct vision. Throughout the                            procedure, the patient's blood pressure, pulse, and                             oxygen saturations were monitored continuously. The                            [id number] ([id number]) Upper was introduced through                            the mouth, and advanced to the second part of                            duodenum. The upper GI endoscopy was accomplished                            without difficulty. The patient tolerated the                            procedure well. Scope In: 11:13:30 AM Scope Out: 11:18:31 AM Total Procedure Duration: 0 hours 5 minutes 1 second  Findings:      The examined esophagus was normal.      Mildly erythematous mucosa without bleeding was found in the gastric       antrum. This was biopsied with a cold forceps for histology.      The duodenal bulb and second portion of the duodenum were normal.       Biopsies were taken with a cold forceps for histology.      The gastroesophageal flap valve was visualized endoscopically and       classified as Hill Grade IV (no fold, wide open lumen, hiatal hernia       present).      A 3 cm hiatal hernia was present. Impression:               - Normal esophagus.                           - Erythematous mucosa in the antrum. Biopsied.                           - Normal duodenal bulb and second portion of the                            duodenum. Biopsied.                           -  Gastroesophageal flap valve classified as Hill                            Grade IV (no fold, wide open lumen, hiatal hernia                            present).                           - 3 cm hiatal hernia.                           -No evidence of bood or active bleeding Moderate Sedation:      Per Anesthesia Care Recommendation:           Follow up path                           Proceed with colonoscopy Procedure Code(s):        --- Professional ---                           [phone], Esophagogastroduodenoscopy, flexible,                            transoral; with biopsy, single or multiple Diagnosis Code(s):         --- Professional ---                           K31.89, Other diseases of stomach and duodenum                           K44.9, Diaphragmatic hernia without obstruction or                            gangrene                           D50.9, Iron deficiency anemia, unspecified CPT copyright 2022 American Medical Association. All rights reserved. The codes documented in this report are preliminary and upon coder review may  be revised to meet current compliance requirements. Deatrice Dine, MD Deatrice Dine, MD 08/07/2024 11:55:46 AM This report has been signed electronically. Number of Addenda: 0

## 2024-08-07 NOTE — Progress Notes (Signed)
 Patient off the floor to endoscopy.

## 2024-08-07 NOTE — Discharge Summary (Addendum)
 Physician Discharge Summary   Patient: Annette Morrison MRN: [id number] DOB: 1955-12-08  Admit date:     08/04/2024  Discharge date: 08/07/24  Discharge Physician: Adriana DELENA Grams   PCP: [name], MD   Recommendations at discharge:   Follow-up with PCP in 1 week Follow-up with a gastroenterologist in 1-2 weeks S/p EGD/colonoscopy -no signs of active bleeding Patient is to restart aspirin , discontinue Plavix   Discharge Diagnoses: Principal Problem:   Symptomatic anemia Active Problems:   Essential hypertension   Tobacco use disorder   Chronic heart failure with mildly reduced ejection fraction (HFmrEF) (HCC)   Acute renal failure superimposed on stage 3a chronic kidney disease (HCC)   Iron deficiency anemia due to chronic blood loss   Gastritis and gastroduodenitis   Grade I hemorrhoids   Hospital Course: Annette Morrison is a 68 year old female with a history of hypertension, hyperlipidemia, hypothyroidism, seizure disorder, CKD stage III,  HFmrEF presenting with low Hgb on routine blood work from PCP.  The patient had her routine annual physical exam with her PCP on 08/03/2024.  Routine blood work was obtained and showed hemoglobin of 4.2 and serum creatinine of 2.12.  The patient was contacted to go to the emergency department for further evaluation and treatment. In speaking with the patient, the patient endorses fatigue and worsening dyspnea on exertion over the past 2 months.  She denies any chest pain, abdominal pain, nausea, vomiting, diarrhea.  She denies any hematochezia, melena, hematemesis, hematuria.  She denies any vaginal bleeding.  She has had some dizziness without any syncope.  She has had some generalized weakness.  She does not drink any alcohol or use any illicit drugs.   She continues to smoke 1 pack/day for the last 40 years.  She takes aspirin  and Plavix  on a daily basis. In the ED, the patient was afebrile hemodynamically stable with oxygen saturation  94% room air.  WBC 6.5, hemoglobin 3.6, platelet 203.  Sodium 137, potassium 3.2, bicarbonate 16, serum creatinine 2.18.  FOBT was negative.  There was no stool in the rectal vault.  3 units PRBC were ordered   Microcytic anemia/symptomatic anemia POA hemoglobin 3.6-s/p 3U PRBC transfusion >>> 9, 9.4    Latest Ref Rng & Units 08/06/2024    4:32 AM 08/05/2024    4:30 AM 08/04/2024   10:10 AM  CBC  WBC 4.0 - 10.5 K/uL 6.1  6.0  6.5   Hemoglobin 12.0 - 15.0 g/dL 9.4  9.0  3.6   Hematocrit 36.0 - 46.0 % 30.4  29.4  14.3   Platelets 150 - 400 K/uL 166  180  203     Iron/TIBC/Ferritin/ %Sat Labs (Brief)          Component Value Date/Time    IRON 14 (L) 08/04/2024 1010    TIBC 489 (H) 08/04/2024 1010    FERRITIN 5 (L) 08/04/2024 1010    IRONPCTSAT 3 (L) 08/04/2024 1010       Vitamin B12: 375.   - S/p iron infusion-followed by iron supplements  08/07/2024 EGD/colonoscopy: Studies within normal limits No significant finding signs of active bleeding     Her aspirin  and Plavix  has been on hold. Continue PPI -Protonix 40 mg p.o. twice daily Resuming aspirin  Discontinue Plavix -no history of stents or indication of continuation of Plavix    Acute kidney injury/chronic kidney disease stage IIIa -Improved creatinine Baseline creatinine is between 1.1-1.4.  Came in with creatinine of 2.18.  Improved to 1.48 this morning. Lab Results  Component Value Date   CREATININE 1.00 08/06/2024   CREATININE 1.48 (H) 08/05/2024   CREATININE 2.18 (H) 08/04/2024      Hypokalemia -was monitored and replaced with magnesium    Chronic systolic CHF/coronary artery disease Last echo is from 2020 which showed EF of 40 to 45%.   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   -Medical management was recommended at that time.  - Aspirin /Plavix  was on hold, discontinue Plavix , continue with aspirin  -She is to follow-up with her cardiologist post discharge     Essential  hypertension-  Blood pressure running soft, holding BP meds Resume bisoprolol , hold Imdur  and spironolactone  until BP stabilizes   Hypothyroidism -Continue levothyroxine .   Hyperlipidemia - Continue statin.   History of seizure disorder Has not had a seizure in over 10 years.  Not on any antiepileptics.   Opioid dependence According to prescriber database she is on Percocet 10 x 325.  120 tablets was prescribed in September.         Consultants: Gastroenterologist Procedures performed: EGD/colonoscopy Disposition: Home Diet recommendation:  Clear liquid diet, advance as tolerated DISCHARGE MEDICATION: Allergies as of 08/07/2024       Reactions   Bee Venom Hives, Itching, Dermatitis, Rash   Hydrocodone  Hives, Dermatitis, Rash   Paroxetine Mesylate Other (See Comments)   Seizures    Hydrocodone -acetaminophen  Itching        Medication List     PAUSE taking these medications    aspirin  EC 81 MG tablet Wait to take this until: August 09, 2024 Take 1 tablet (81 mg total) by mouth daily.   furosemide  20 MG tablet Wait to take this until: August 09, 2024 Commonly known as: LASIX  Take 1 tablet (20 mg total) by mouth daily.   isosorbide  mononitrate 30 MG 24 hr tablet Wait to take this until: August 13, 2024 Commonly known as: IMDUR  Take 0.5 tablets (15 mg total) by mouth daily.   spironolactone  25 MG tablet Wait to take this until: August 11, 2024 Commonly known as: ALDACTONE  Take 0.5 tablets (12.5 mg total) by mouth daily.       STOP taking these medications    clopidogrel  75 MG tablet Commonly known as: PLAVIX    famotidine  20 MG tablet Commonly known as: PEPCID    traMADol 50 MG tablet Commonly known as: ULTRAM       TAKE these medications    atenolol 50 MG tablet Commonly known as: TENORMIN Take 50 mg by mouth daily.   estrogens (conjugated) 0.625 MG tablet Commonly known as: PREMARIN Take 0.625 mg by mouth daily.   ferrous sulfate 325  (65 FE) MG EC tablet Take 1 tablet (325 mg total) by mouth 2 (two) times daily.   levothyroxine  125 MCG tablet Commonly known as: SYNTHROID  Take 1 tablet (125 mcg total) by mouth every morning. What changed: Another medication with the same name was removed. Continue taking this medication, and follow the directions you see here.   nicotine  14 mg/24hr patch Commonly known as: NICODERM CQ  - dosed in mg/24 hours Place 1 patch (14 mg total) onto the skin daily. Start taking on: August 08, 2024   pantoprazole 40 MG tablet Commonly known as: Protonix Take 1 tablet (40 mg total) by mouth 2 (two) times daily.   Percocet 10-325 MG tablet Generic drug: oxyCODONE -acetaminophen  Take 1 tablet by mouth 4 (four) times daily as needed for pain.   rosuvastatin  20 MG tablet Commonly known as: CRESTOR  Take 20 mg  by mouth daily.   zolpidem 10 MG tablet Commonly known as: AMBIEN Take 10 mg by mouth at bedtime as needed for sleep.        Discharge Exam: Filed Weights   08/04/24 1345 08/05/24 1119 08/07/24 1040  Weight: 35.8 kg 35.7 kg 35.4 kg     General:  AAO x 3,  cooperative, no distress;   HEENT:  Normocephalic, PERRL, otherwise with in Normal limits   Neuro:  CNII-XII intact. , normal motor and sensation, reflexes intact   Lungs:   Clear to auscultation BL, Respirations unlabored,  No wheezes / crackles  Cardio:    S1/S2, RRR, No murmure, No Rubs or Gallops   Abdomen:  Soft, non-tender, bowel sounds active all four quadrants, no guarding or peritoneal signs.  Muscular  skeletal:  Limited exam -global generalized weaknesses - in bed, able to move all 4 extremities,   2+ pulses,  symmetric, No pitting edema  Skin:  Dry, warm to touch, negative for any Rashes,  Wounds: Please see nursing documentation      Condition at discharge: good  The results of significant diagnostics from this hospitalization (including imaging, microbiology, ancillary and laboratory) are listed below  for reference.   Imaging Studies: ECHOCARDIOGRAM COMPLETE Result Date: 08/05/2024    ECHOCARDIOGRAM REPORT   Patient Name:   Annette Morrison Date of Exam: 08/05/2024 Medical Rec #:  [id number]      Height:       64.0 in Accession #:    [id number]     Weight:       78.9 lb Date of Birth:  1956/02/22      BSA:          1.318 m Patient Age:    68 years       BP:           97/53 mmHg Patient Gender: F              HR:           64 bpm. Exam Location:  [facility] Procedure: 2D Echo, Cardiac Doppler, Color Doppler and Intracardiac            Opacification Agent (Both Spectral and Color Flow Doppler were            utilized during procedure). Indications:    Abnormal EKG  History:        Patient has prior history of Echocardiogram examinations, most                 recent 06/25/2019. CHF, CAD and Previous Myocardial Infarction;                 Risk Factors:Hypertension, Dyslipidemia and Current Smoker.  Sonographer:    Aida Pizza RCS Referring Phys: [phone] [facility]  Sonographer Comments: Technically difficult study due to poor echo windows. IMPRESSIONS  1. Left ventricular ejection fraction, by estimation, is 50 to 55%. The left ventricle has low normal function. The left ventricle demonstrates regional wall motion abnormalities, mid-inferolateral wall is akinetic (SAX view). Left ventricular diastolic  parameters are indeterminate.  2. Right ventricular systolic function is low normal. The right ventricular size is moderately enlarged.  3. Left atrial size was mildly dilated.  4. The mitral valve is normal in structure. Mild mitral valve regurgitation. No evidence of mitral stenosis.  5. The tricuspid valve is degenerative.  6. The aortic valve is normal in structure. Aortic valve regurgitation is not visualized. No aortic stenosis is  present.  7. The inferior vena cava is normal in size with greater than 50% respiratory variability, suggesting right atrial pressure of 3 mmHg. FINDINGS  Left Ventricle: Left  ventricular ejection fraction, by estimation, is 50 to 55%. The left ventricle has low normal function. The left ventricle demonstrates regional wall motion abnormalities. Definity contrast agent was given IV to delineate the left ventricular endocardial borders. Strain was performed and the global longitudinal strain is indeterminate. The left ventricular internal cavity size was normal in size. There is no left ventricular hypertrophy. Left ventricular diastolic parameters are indeterminate. Right Ventricle: The right ventricular size is moderately enlarged. No increase in right ventricular wall thickness. Right ventricular systolic function is low normal. Left Atrium: Left atrial size was mildly dilated. Right Atrium: Right atrial size was normal in size. Pericardium: There is no evidence of pericardial effusion. Mitral Valve: The mitral valve is normal in structure. Mild mitral valve regurgitation. No evidence of mitral valve stenosis. Tricuspid Valve: The tricuspid valve is degenerative in appearance. Tricuspid valve regurgitation is mild . No evidence of tricuspid stenosis. Aortic Valve: The aortic valve is normal in structure. Aortic valve regurgitation is not visualized. No aortic stenosis is present. Pulmonic Valve: The pulmonic valve was normal in structure. Pulmonic valve regurgitation is not visualized. No evidence of pulmonic stenosis. Aorta: The aortic root is normal in size and structure. Venous: The inferior vena cava is normal in size with greater than 50% respiratory variability, suggesting right atrial pressure of 3 mmHg. IAS/Shunts: No atrial level shunt detected by color flow Doppler. Additional Comments: 3D was performed not requiring image post processing on an independent workstation and was indeterminate.  LEFT VENTRICLE PLAX 2D LVIDd:         4.60 cm   Diastology LVIDs:         3.50 cm   LV e' medial:    8.70 cm/s LV PW:         0.70 cm   LV E/e' medial:  10.8 LV IVS:        0.80 cm   LV e'  lateral:   8.95 cm/s LVOT diam:     1.70 cm   LV E/e' lateral: 10.5 LV SV:         54 LV SV Index:   41 LVOT Area:     2.27 cm  RIGHT VENTRICLE RV S prime:     12.70 cm/s TAPSE (M-mode): 2.0 cm LEFT ATRIUM           Index        RIGHT ATRIUM           Index LA diam:      3.10 cm 2.35 cm/m   RA Area:     10.00 cm LA Vol (A2C): 44.6 ml 33.85 ml/m  RA Volume:   23.20 ml  17.61 ml/m LA Vol (A4C): 22.9 ml 17.38 ml/m  AORTIC VALVE LVOT Vmax:   113.00 cm/s LVOT Vmean:  62.600 cm/s LVOT VTI:    0.239 m  AORTA Ao Root diam: 2.90 cm MITRAL VALVE               TRICUSPID VALVE MV Area (PHT): 4.58 cm    TR Peak grad:   25.8 mmHg MV Decel Time: 166 msec    TR Vmax:        254.00 cm/s MV E velocity: 93.90 cm/s MV A velocity: 81.30 cm/s  SHUNTS MV E/A ratio:  1.15  Systemic VTI:  0.24 m                            Systemic Diam: 1.70 cm Vishnu Priya Mallipeddi Electronically signed by Diannah Late Mallipeddi Signature Date/Time: 08/05/2024/4:35:20 PM    Final    US  RENAL Result Date: 08/04/2024 EXAM: US  Retroperitoneum Complete, Renal. CLINICAL HISTORY: Acute kidney injury (AKI). TECHNIQUE: Real-time ultrasound of the retroperitoneum (complete) with image documentation. COMPARISON: None provided. FINDINGS: RIGHT KIDNEY: The right kidney measures 11.4 x 4.8 x 4.6 cm (volume 137 cc). Shadowing right renal calculi up to 8 mm in diameter. 1.1 cm cyst of the right kidney upper pole with benign imaging characteristics. No hydronephrosis visualized. LEFT KIDNEY: Left kidney measures 11.0 x 4.3 x 5.0 cm (volume 124 cc). Multiple left renal cysts with benign imaging characteristics. No hydronephrosis, or renal stone visualized. BLADDER: Bladder volume 370 cc. Unremarkable as visualized. IMPRESSION: 1. Right nephrolithiasis with calculi up to 8 mm. 2. Multiple left renal cysts with benign imaging characteristics. Electronically signed by: Ryan Salvage MD 08/04/2024 06:24 PM EDT RP Workstation: [id number]     Microbiology: Results for orders placed or performed during the hospital encounter of 08/04/24  MRSA Next Gen by PCR, Nasal     Status: None   Collection Time: 08/04/24  8:15 AM   Specimen: Nasal Mucosa; Nasal Swab  Result Value Ref Range Status   MRSA by PCR Next Gen NOT DETECTED NOT DETECTED Final    Comment: (NOTE) The GeneXpert MRSA Assay (FDA approved for NASAL specimens only), is one component of a comprehensive MRSA colonization surveillance program. It is not intended to diagnose MRSA infection nor to guide or monitor treatment for MRSA infections. Test performance is not FDA approved in patients less than 2 years old. Performed at [facility], [address], [place], [place] [address]     Labs: CBC: Recent Labs  Lab 08/04/24 1010 08/05/24 0430 08/06/24 0432  WBC 6.5 6.0 6.1  NEUTROABS 4.5  --   --   HGB 3.6* 9.0* 9.4*  HCT 14.3* 29.4* 30.4*  MCV 73.7* 80.8 82.2  PLT 203 180 166   Basic Metabolic Panel: Recent Labs  Lab 08/04/24 1010 08/05/24 0430 08/05/24 0515 08/06/24 0432  NA 137 139  --  138  K 3.2* 3.1*  --  3.6  CL 106 110  --  110  CO2 16* 17*  --  17*  GLUCOSE 92 87  --  83  BUN 35* 21  --  10  CREATININE 2.18* 1.48*  --  1.00  CALCIUM  8.6* 8.9  --  9.3  MG  --   --  2.2 2.1   Liver Function Tests: Recent Labs  Lab 08/04/24 1010  AST 25  ALT 13  ALKPHOS 77  BILITOT 0.3  PROT 6.7  ALBUMIN 4.1   CBG: No results for input(s): GLUCAP in the last 168 hours.  Discharge time spent: greater than 30 minutes.  Signed: Adriana DELENA Grams, MD [facility] 08/07/2024

## 2024-08-07 NOTE — Anesthesia Postprocedure Evaluation (Signed)
 Anesthesia Post Note  Patient: Annette Morrison  Procedure(s) Performed: COLONOSCOPY EGD (ESOPHAGOGASTRODUODENOSCOPY)  Patient location during evaluation: PACU Anesthesia Type: General Level of consciousness: awake and alert Pain management: pain level controlled Vital Signs Assessment: post-procedure vital signs reviewed and stable Respiratory status: spontaneous breathing, nonlabored ventilation, respiratory function stable and patient connected to nasal cannula oxygen Cardiovascular status: stable Anesthetic complications: no   There were no known notable events for this encounter.   Last Vitals:  Vitals:   08/07/24 1215 08/07/24 1243  BP: 131/82 126/74  Pulse: 76 83  Resp: 18 18  Temp:  (!) 36.3 C  SpO2: 100% 98%    Last Pain:  Vitals:   08/07/24 1243  TempSrc: Oral  PainSc:                  Carlin LITTIE Kawasaki

## 2024-08-07 NOTE — Interval H&P Note (Signed)
 History and Physical Interval Note:  08/07/2024 11:07 AM  Annette Morrison  has presented today for surgery, with the diagnosis of iron deficiency anemia, hemoccult positive stool.  The various methods of treatment have been discussed with the patient and family. After consideration of risks, benefits and other options for treatment, the patient has consented to  Procedure(s): COLONOSCOPY (N/A) EGD (ESOPHAGOGASTRODUODENOSCOPY) (N/A) as a surgical intervention.  The patient's history has been reviewed, patient examined, no change in status, stable for surgery.  I have reviewed the patient's chart and labs.  Questions were answered to the patient's satisfaction.    I thoroughly discussed with the patient the procedure, including the risks involved. Patient understands what the procedure involves including the benefits and any risks. Patient understands alternatives to the proposed procedure. Risks including (but not limited to) bleeding, tearing of the lining (perforation), rupture of adjacent organs, problems with heart and lung function, infection, and medication reactions. A small percentage of complications may require surgery, hospitalization, repeat endoscopic procedure, and/or transfusion.  Patient understood and agreed.     [name]

## 2024-08-07 NOTE — Op Note (Addendum)
 [facility], [place] Patient Name: Annette Morrison Procedure Date: 08/07/2024 10:11 AM MRN: [id number] Date of Birth: [birth year] Attending MD: Deatrice Dine , MD, [id number] CSN: [id number] Age: 68 Admit Type: Inpatient Procedure:                Colonoscopy Indications:              Unexplained iron deficiency anemia; Ferritin 5 HBG                            3.6 Providers:                Deatrice Dine, MD, Harlene Lips, Bascom Blush Referring MD:              Medicines:                Monitored Anesthesia Care Complications:            No immediate complications. Estimated Blood Loss:     Estimated blood loss: none. Procedure:                Pre-Anesthesia Assessment:                           - Prior to the procedure, a History and Physical                            was performed, and patient medications and                            allergies were reviewed. The patient's tolerance of                            previous anesthesia was also reviewed. The risks                            and benefits of the procedure and the sedation                            options and risks were discussed with the patient.                            All questions were answered, and informed consent                            was obtained. Prior Anticoagulants: The patient has                            taken Plavix  (clopidogrel ), last dose was 5 days                            prior to procedure. ASA Grade Assessment: III - A                            patient with severe systemic disease. After  reviewing the risks and benefits, the patient was                            deemed in satisfactory condition to undergo the                            procedure.                           After obtaining informed consent, the colonoscope                            was passed under direct vision. Throughout the                            procedure, the patient's blood  pressure, pulse, and                            oxygen saturations were monitored continuously. The                            [id number]-[id number] ([id number]) Peds Colon was introduced                            through the anus and advanced to the the terminal                            ileum. The terminal ileum, ileocecal valve,                            appendiceal orifice, and rectum were photographed. Scope In: 11:22:34 AM Scope Out: 11:45:20 AM Scope Withdrawal Time: 0 hours 19 minutes 37 seconds  Total Procedure Duration: 0 hours 22 minutes 46 seconds  Findings:      The colon (entire examined portion) was tortuous.      There is no endoscopic evidence of bleeding, mass or polyps in the       entire colon.      The terminal ileum appeared normal.      Non-bleeding internal hemorrhoids were found during endoscopy. The       hemorrhoids were small. Retroflexion not performed due to narrow rectal       vault Impression:               - Tortuous colon.                           - The examined portion of the ileum was normal.                           - Non-bleeding internal hemorrhoids.                           - No specimens collected. Moderate Sedation:      Per Anesthesia Care Recommendation:           -Upper endoscopy and colonoscopy does not explain  cause of severe iron deficiency anemia , will                            schedule patient for capsule endoscopy as outpatient                           -No absolute GI contraindication to restarting                            clinically indicated anticoagulation/antiplatelet                            drugs while patientis closely monitored                           -IV iron loading while patient is admitted and                            follow up as outpatient with hematology for severe                            IDA                           - Resume previous diet.                           - Continue present  medications.                           - Repeat colonoscopy in 10 years for screening                            purposes. Procedure Code(s):        --- Professional ---                           [phone], Colonoscopy, flexible; diagnostic, including                            collection of specimen(s) by brushing or washing,                            when performed (separate procedure) Diagnosis Code(s):        --- Professional ---                           K64.8, Other hemorrhoids                           D50.9, Iron deficiency anemia, unspecified                           Q43.8, Other specified congenital malformations of                            intestine CPT copyright 2022 American  [employer] Association. All rights reserved. The codes documented in this report are preliminary and upon coder review may  be revised to meet current compliance requirements. Deatrice Dine, MD Deatrice Dine, MD 08/07/2024 12:06:21 PM This report has been signed electronically. Number of Addenda: 0

## 2024-08-07 NOTE — Transfer of Care (Signed)
 Immediate Anesthesia Transfer of Care Note  Patient: Annette Morrison  Procedure(s) Performed: COLONOSCOPY EGD (ESOPHAGOGASTRODUODENOSCOPY)  Patient Location: PACU  Anesthesia Type:General  Level of Consciousness: sedated  Airway & Oxygen Therapy: Patient Spontanous Breathing  Post-op Assessment: Report given to RN  Post vital signs: stable  Last Vitals:  Vitals Value Taken Time  BP 120/73 08/07/24 11:52  Temp    Pulse 97 08/07/24 11:53  Resp 18 08/07/24 11:53  SpO2 100 % 08/07/24 11:53  Vitals shown include unfiled device data.  Last Pain:  Vitals:   08/07/24 1109  TempSrc:   PainSc: 0-No pain      Patients Stated Pain Goal: 8 (08/07/24 1040)  Complications: There were no known notable events for this encounter.

## 2024-08-08 LAB — TYPE AND SCREEN
ABO/RH(D): AB POS
Antibody Screen: NEGATIVE
Unit division: 0
Unit division: 0
Unit division: 0
Unit division: 0
Unit division: 0

## 2024-08-08 LAB — BPAM RBC
Blood Product Expiration Date: 202510182359
Blood Product Expiration Date: 202510192359
Blood Product Expiration Date: 202510192359
Blood Product Expiration Date: 202510192359
Blood Product Expiration Date: 202510192359
ISSUE DATE / TIME: 202510011151
ISSUE DATE / TIME: 202510011459
ISSUE DATE / TIME: 202510011715
Unit Type and Rh: 6200
Unit Type and Rh: 6200
Unit Type and Rh: 6200
Unit Type and Rh: 6200
Unit Type and Rh: 6200

## 2024-08-09 ENCOUNTER — Telehealth: Payer: Self-pay | Admitting: *Deleted

## 2024-08-09 NOTE — Telephone Encounter (Signed)
-----   Message from Deatrice JULIANNA Dine sent at 08/07/2024 12:06 PM EDT ----- Regarding: OV and capsule Hi Constantinos Krempasky,   Can you please schedule a Capsule endoscopy ? Dx: iron deficiency anemia. Room:    [facility] Pulling,  Can you please schedule a follow up appointment for this patient in 1-2 weeks  with Dr Shaaron or any of the APPs?   Thanks,  Muhammad Faizan Ahmed, MD Gastroenterology and Hepatology [facility]astroenterology

## 2024-08-09 NOTE — Progress Notes (Signed)
 [facility] Transitional Care Management     Situation   Annette Morrison is a 67 y.o. female who was contacted today for a transitional care outreach.  Admission Date:  08/04/24  Discharge Date: 08/07/24   Institution: [facility]  Diagnosis:  Iron deficiency anemia  Is this visit eligible for TCM? Yes  Background   Since Discharge: Patient is doing good.  She still feels short of breath with exertion.  Some fatigue.  No dizziness, nausea, vomiting, chest pain, or fever.  No bowel or bladder issues.  No noticeable bleeding, dark stools, or blood in urine.  Eating and drinking well.  She is ambulatory without assistance.  She is going to pharmacy today.  She has paused Aspirin , Furosemide , Isosorbide , and Spironolactone  as directed.  She has also stopped Clopidogrel , Famotidine , and Tramadol.  She has appointment scheduled with PCP office on 10/14.  She denies any concerns at this time.  She will call if needed.    I reviewed current EMR med list and facility discharge medications. Please see note in EMR med list for findings.   Primary Care Provider on Record: Annette FORBES Shirk, MD   Assessment    General Assessment     Type of Visit Telephone   Assessment Completed With Patient   Interpreter Used No   Bed or Wheelchair Confined No   Fall History No      Flowsheet Row Most Recent Value  Engagement   Call number 1  Two calls made/Contact successful Yes  Is the patient eligible? Yes  Medications   Appointments   Self Management   Patient Teaching   Wrap Up   Two calls made/Contact successful Yes    Recommendation    PCP/specialist notified: No  Referral Made: No  Referrals made to other disciplines: None   Future Appointments  Date Time Provider Department Center  08/17/2024  3:15 PM Annette Elspeth Rhein, MD [facility] [address]  11/03/2024  3:15 PM Annette FORBES Shirk, MD [facility] [address], Annette Morrison  Health  Navigator [phone]  Electronically signed by: Annette Macario Romano, RN 08/09/2024 11:53 AM

## 2024-08-09 NOTE — Telephone Encounter (Signed)
 Called # in chart and received message it is not in service. Also don't see a current DPR on file. Will mail letter to call

## 2024-08-10 LAB — SURGICAL PATHOLOGY

## 2024-08-12 ENCOUNTER — Telehealth (INDEPENDENT_AMBULATORY_CARE_PROVIDER_SITE_OTHER): Payer: Self-pay | Admitting: Gastroenterology

## 2024-08-12 ENCOUNTER — Ambulatory Visit (INDEPENDENT_AMBULATORY_CARE_PROVIDER_SITE_OTHER): Payer: Self-pay | Admitting: Gastroenterology

## 2024-08-12 ENCOUNTER — Encounter (INDEPENDENT_AMBULATORY_CARE_PROVIDER_SITE_OTHER): Payer: Self-pay | Admitting: Internal Medicine

## 2024-08-12 ENCOUNTER — Encounter (HOSPITAL_COMMUNITY): Payer: Self-pay | Admitting: Gastroenterology

## 2024-08-12 NOTE — Telephone Encounter (Signed)
 Ahmed, Muhammad F, MD  Annette Morrison, Annette Morrison, CMA Hi Annette,  Can you please schedule a Capsule endoscopy ? Dx: iron deficiency anemia. Room:   [name],  Can you please schedule a follow up appointment for this patient in 1-2 weeks  with Dr Shaaron or any of the APPs?   Thanks,  Muhammad Faizan Ahmed, MD Gastroenterology and Hepatology [facility] Gastroenterology   Unable to reach patient or her emergency contact by phone to schedule OV. I mailed letter for patient to contact office so we can get her scheduled to see RMR or any APP per Dr Cinderella.

## 2024-08-16 ENCOUNTER — Ambulatory Visit: Admitting: Gastroenterology

## 2024-08-20 NOTE — Progress Notes (Signed)
 10 yr TCS noted in recall Patient result letter mailed
